# Patient Record
Sex: Male | Born: 1935 | Race: White | Hispanic: No | Smoking: Never smoker
Health system: Southern US, Community
[De-identification: ages and names within clinical notes are randomized; demographics above are authoritative.]

## PROBLEM LIST (undated history)

## (undated) DIAGNOSIS — E7800 Pure hypercholesterolemia, unspecified: Secondary | ICD-10-CM

## (undated) DIAGNOSIS — I10 Essential (primary) hypertension: Secondary | ICD-10-CM

## (undated) DIAGNOSIS — E119 Type 2 diabetes mellitus without complications: Secondary | ICD-10-CM

## (undated) DIAGNOSIS — I2510 Atherosclerotic heart disease of native coronary artery without angina pectoris: Secondary | ICD-10-CM

## (undated) HISTORY — PX: EYE SURGERY: SHX253

## (undated) HISTORY — PX: HEMORRHOID SURGERY: SHX153

## (undated) HISTORY — PX: COLECTOMY: SHX59

## (undated) HISTORY — PX: TONSILLECTOMY: SUR1361

## (undated) HISTORY — PX: OTHER SURGICAL HISTORY: SHX169

---
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
## 1990-09-19 HISTORY — PX: CHOLECYSTECTOMY: SHX55

## 2008-08-01 ENCOUNTER — Emergency Department (HOSPITAL_BASED_OUTPATIENT_CLINIC_OR_DEPARTMENT_OTHER): Admission: EM | Admit: 2008-08-01 | Discharge: 2008-08-01 | Payer: Self-pay | Admitting: Emergency Medicine

## 2013-06-27 ENCOUNTER — Emergency Department (HOSPITAL_BASED_OUTPATIENT_CLINIC_OR_DEPARTMENT_OTHER): Payer: Medicare HMO

## 2013-06-27 ENCOUNTER — Emergency Department (HOSPITAL_BASED_OUTPATIENT_CLINIC_OR_DEPARTMENT_OTHER)
Admission: EM | Admit: 2013-06-27 | Discharge: 2013-06-27 | Disposition: A | Discharge status: Home / Self Care | Payer: Medicare HMO | Attending: Emergency Medicine | Admitting: Emergency Medicine

## 2013-06-27 ENCOUNTER — Encounter (HOSPITAL_BASED_OUTPATIENT_CLINIC_OR_DEPARTMENT_OTHER): Payer: Self-pay | Admitting: Emergency Medicine

## 2013-06-27 DIAGNOSIS — I10 Essential (primary) hypertension: Secondary | ICD-10-CM | POA: Insufficient documentation

## 2013-06-27 DIAGNOSIS — I2510 Atherosclerotic heart disease of native coronary artery without angina pectoris: Secondary | ICD-10-CM | POA: Insufficient documentation

## 2013-06-27 DIAGNOSIS — Z7982 Long term (current) use of aspirin: Secondary | ICD-10-CM | POA: Insufficient documentation

## 2013-06-27 DIAGNOSIS — T148XXA Other injury of unspecified body region, initial encounter: Secondary | ICD-10-CM

## 2013-06-27 DIAGNOSIS — E7800 Pure hypercholesterolemia, unspecified: Secondary | ICD-10-CM | POA: Insufficient documentation

## 2013-06-27 DIAGNOSIS — S61409A Unspecified open wound of unspecified hand, initial encounter: Secondary | ICD-10-CM | POA: Insufficient documentation

## 2013-06-27 DIAGNOSIS — S0003XA Contusion of scalp, initial encounter: Secondary | ICD-10-CM | POA: Insufficient documentation

## 2013-06-27 DIAGNOSIS — R296 Repeated falls: Secondary | ICD-10-CM | POA: Insufficient documentation

## 2013-06-27 DIAGNOSIS — Y9389 Activity, other specified: Secondary | ICD-10-CM | POA: Insufficient documentation

## 2013-06-27 DIAGNOSIS — W19XXXA Unspecified fall, initial encounter: Secondary | ICD-10-CM

## 2013-06-27 DIAGNOSIS — E119 Type 2 diabetes mellitus without complications: Secondary | ICD-10-CM | POA: Insufficient documentation

## 2013-06-27 DIAGNOSIS — S61412A Laceration without foreign body of left hand, initial encounter: Secondary | ICD-10-CM

## 2013-06-27 DIAGNOSIS — Z23 Encounter for immunization: Secondary | ICD-10-CM | POA: Insufficient documentation

## 2013-06-27 DIAGNOSIS — Y929 Unspecified place or not applicable: Secondary | ICD-10-CM | POA: Insufficient documentation

## 2013-06-27 DIAGNOSIS — Z9861 Coronary angioplasty status: Secondary | ICD-10-CM | POA: Insufficient documentation

## 2013-06-27 DIAGNOSIS — Z79899 Other long term (current) drug therapy: Secondary | ICD-10-CM | POA: Insufficient documentation

## 2013-06-27 HISTORY — DX: Atherosclerotic heart disease of native coronary artery without angina pectoris: I25.10

## 2013-06-27 HISTORY — DX: Type 2 diabetes mellitus without complications: E11.9

## 2013-06-27 HISTORY — DX: Pure hypercholesterolemia, unspecified: E78.00

## 2013-06-27 HISTORY — DX: Essential (primary) hypertension: I10

## 2013-06-27 MED ORDER — HYDROCODONE-ACETAMINOPHEN 5-325 MG PO TABS: 1.0000 | ORAL_TABLET | Freq: Three times a day (TID) | ORAL | Status: DC | PRN

## 2013-06-27 MED ORDER — SULFAMETHOXAZOLE-TMP DS 800-160 MG PO TABS
1.0000 | ORAL_TABLET | Freq: Once | ORAL | Status: AC
  Administered 2013-06-27: 1 via ORAL
  Filled 2013-06-27: qty 1

## 2013-06-27 MED ORDER — TETANUS-DIPHTH-ACELL PERTUSSIS 5-2.5-18.5 LF-MCG/0.5 IM SUSP
0.5000 mL | Freq: Once | INTRAMUSCULAR | Status: AC
  Administered 2013-06-27: 0.5 mL via INTRAMUSCULAR
  Filled 2013-06-27: qty 0.5

## 2013-06-27 MED ORDER — CEPHALEXIN 250 MG PO CAPS
500.0000 mg | ORAL_CAPSULE | Freq: Once | ORAL | Status: AC
  Administered 2013-06-27: 500 mg via ORAL
  Filled 2013-06-27: qty 2

## 2013-06-27 MED ORDER — CEPHALEXIN 500 MG PO CAPS: 500.0000 mg | ORAL_CAPSULE | Freq: Four times a day (QID) | ORAL | Status: DC

## 2013-06-27 MED ORDER — SULFAMETHOXAZOLE-TRIMETHOPRIM 800-160 MG PO TABS: 1.0000 | ORAL_TABLET | Freq: Two times a day (BID) | ORAL | Status: DC

## 2013-06-27 MED ORDER — IBUPROFEN 400 MG PO TABS: 400.0000 mg | ORAL_TABLET | Freq: Four times a day (QID) | ORAL | Status: DC | PRN

## 2013-06-27 NOTE — ED Notes (Signed)
When to get the mail and fell. Hematoma to his left forehead. Skin tear to his left hand. Abrasion to his left 5th toes. No LOC.

## 2013-06-27 NOTE — ED Provider Notes (Addendum)
CSN: [id number]     Arrival date & time 06/27/13  1346 History   First MD Initiated Contact with Patient 06/27/13 1432     Chief Complaint  Patient presents with  . Fall   (Consider location/radiation/quality/duration/timing/severity/associated sxs/prior Treatment) HPI Comments: 79 y/o male comes in post fall. Hx of CAD, DN, HTN. Had a mechanical fall while getting mail. Denies any headaches, nausea, vomiting, visual complains, seizures, altered mental status, loss of consciousness, new weakness, or numbness, no gait instability. Pt has pain in his left hand. He is right handed. Pt reports easily tearing his skin and having some bleed.    Patient is a 79 y.o. male presenting with fall. The history is provided by the patient.  Fall Pertinent negatives include no chest pain, no abdominal pain and no shortness of breath.    Past Medical History  Diagnosis Date  . Diabetes mellitus without complication   . Hypertension   . Coronary artery disease   . High cholesterol    Past Surgical History  Procedure Laterality Date  . Tonsillectomy    . Cholecystectomy    . Eye surgery    . Cardiac stents    . Appendectomy     No family history on file. History  Substance Use Topics  . Smoking status: Never Smoker   . Smokeless tobacco: Not on file  . Alcohol Use: No    Review of Systems  Constitutional: Negative for activity change and appetite change.  Respiratory: Negative for cough and shortness of breath.   Cardiovascular: Negative for chest pain.  Gastrointestinal: Negative for abdominal pain.  Genitourinary: Negative for dysuria.  Musculoskeletal: Positive for arthralgias.  Skin: Positive for wound.  Hematological: Bruises/bleeds easily.    Allergies  Codeine  Home Medications   Current Outpatient Rx  Name  Route  Sig  Dispense  Refill  . aspirin 81 MG tablet   Oral   Take 81 mg by mouth daily.         . fenofibrate (TRICOR) 145 MG tablet   Oral   Take 145 mg by  mouth daily.         . folic acid (FOLVITE) 1 MG tablet   Oral   Take 1 mg by mouth daily.         Marland Kitchen glipiZIDE (GLUCOTROL XL) 10 MG 24 hr tablet   Oral   Take 10 mg by mouth daily.         . Levothyroxine Sodium (SYNTHROID PO)   Oral   Take by mouth.         . meloxicam (MOBIC) 7.5 MG tablet   Oral   Take 7.5 mg by mouth daily.         . metFORMIN (GLUCOPHAGE) 500 MG tablet   Oral   Take 500 mg by mouth 2 (two) times daily with a meal.         . metoprolol (LOPRESSOR) 100 MG tablet   Oral   Take 100 mg by mouth 2 (two) times daily.         Marland Kitchen omeprazole (PRILOSEC) 20 MG capsule   Oral   Take 20 mg by mouth daily.         . simvastatin (ZOCOR) 40 MG tablet   Oral   Take 40 mg by mouth every evening.          BP 127/54  Pulse 60  Temp(Src) 98.3 F (36.8 C) (Oral)  Resp 18  SpO2 95% Physical  Exam  Constitutional: He is oriented to person, place, and time. He appears well-developed.  HENT:  Head: Normocephalic and atraumatic.  Eyes: Conjunctivae and EOM are normal. Pupils are equal, round, and reactive to light.  Neck: Normal range of motion. Neck supple.  No midline c-spine tenderness, pt able to turn head to 45 degrees bilaterally without any pain and able to flex neck to the chest and extend without any pain or neurologic symptoms.   Cardiovascular: Normal rate and regular rhythm.   Pulmonary/Chest: Effort normal and breath sounds normal.  Abdominal: Soft. Bowel sounds are normal. He exhibits no distension. There is no tenderness. There is no rebound and no guarding.  Musculoskeletal:  Head to toe evaluation shows + hematoma, + left hand - large skin tear/abrasion - exposing the   Negative: bleeding of the scalp, no facial abrasions, step offs, crepitus, no tenderness to palpation of the bilateral upper and lower extremities, no gross deformities, no chest tenderness, no pelvic pain.   Neurological: He is alert and oriented to person, place,  and time.  Skin: Skin is warm.    ED Course  Procedures (including critical care time) Labs Review Labs Reviewed - No data to display Imaging Review Ct Head Wo Contrast  06/27/2013   CLINICAL DATA:  Fall, large left frontal hematoma  EXAM: CT HEAD WITHOUT CONTRAST  TECHNIQUE: Contiguous axial images were obtained from the base of the skull through the vertex without intravenous contrast.  COMPARISON:  08/01/2008  FINDINGS: No evidence of parenchymal hemorrhage or extra-axial fluid collection. No mass lesion, mass effect, or midline shift.  No CT evidence of acute infarction.  Subcortical white matter and periventricular small vessel ischemic changes. Intracranial atherosclerosis.  Age related atrophy. No ventriculomegaly.  The visualized paranasal sinuses are essentially clear. The mastoid air cells are unopacified.  Extracranial hematoma overlying the left frontal bone, measuring 7 mm in thickness (series 2/ image 22).  No evidence of calvarial fracture.  IMPRESSION: Extracranial hematoma overlying the left frontal bone.  No evidence of calvarial fracture.  No evidence of acute intracranial abnormality.   Electronically Signed   By: Charline Bills M.D.   On: 06/27/2013 14:57   Dg Hand Complete Left  06/27/2013   CLINICAL DATA:  Left hand pain, status post fall, laceration overlying the 4th and 5th metacarpals  EXAM: LEFT HAND - COMPLETE 3+ VIEW  COMPARISON:  None.  FINDINGS: No fracture or dislocation is seen.  Mild degenerative changes.  Soft tissue laceration along the medial/ulnar aspect of the hand.  Associated 2 mm radiopaque foreign body adjacent to the base of the 5th metacarpal.  IMPRESSION: No fracture or dislocation is seen.  Soft tissue laceration along the medial/ulnar aspect of the hand.  Associated 2 mm radiopaque foreign body adjacent to the base of the 5th metacarpal.   Electronically Signed   By: Charline Bills M.D.   On: 06/27/2013 14:50   Dg Foot Complete Left  06/27/2013    CLINICAL DATA:  Fall, pain/bruising to 4th and 5th digits  EXAM: LEFT FOOT - COMPLETE 3+ VIEW  COMPARISON:  None.  FINDINGS: No fracture or dislocation is seen.  Mild degenerative changes of the 1st MTP joint.  Small plantar and posterior calcaneal enthesophytes.  The visualized soft tissues are unremarkable.  IMPRESSION: No fracture or dislocation is seen.   Electronically Signed   By: Charline Bills M.D.   On: 06/27/2013 14:50    EKG Interpretation   None  MDM  No diagnosis found.  DDx includes: - Mechanical falls - ICH - Fractures - Contusions - Soft tissue injury  Pt comes in post fall. CT head is normal, cspine cleared clinically with [place] CT cspine and nexus criteria. Pt has abrasion to the left hand, with skin tear and loss, with underlying muscle and bone exposure. Pt is diabetic, we will start him on antibiotics and proximate the wound with steri strips, as the skin loss is significant enough where appropriate proximation will be tough.  Wound care discussed, return precautions discussed.    Derwood Kaplan, MD 06/27/13 1543  3:55 PM No foreign body. Will dress the wound, he was soaked in betadine for 15 minutes.   Derwood Kaplan, MD 06/27/13 1555

## 2014-07-05 ENCOUNTER — Emergency Department (HOSPITAL_BASED_OUTPATIENT_CLINIC_OR_DEPARTMENT_OTHER): Payer: Medicare HMO

## 2014-07-05 ENCOUNTER — Encounter (HOSPITAL_BASED_OUTPATIENT_CLINIC_OR_DEPARTMENT_OTHER): Payer: Self-pay | Admitting: Emergency Medicine

## 2014-07-05 ENCOUNTER — Emergency Department (HOSPITAL_BASED_OUTPATIENT_CLINIC_OR_DEPARTMENT_OTHER)
Admission: EM | Admit: 2014-07-05 | Discharge: 2014-07-05 | Disposition: A | Discharge status: Home / Self Care | Payer: Medicare HMO | Attending: Emergency Medicine | Admitting: Emergency Medicine

## 2014-07-05 DIAGNOSIS — S0511XA Contusion of eyeball and orbital tissues, right eye, initial encounter: Secondary | ICD-10-CM | POA: Diagnosis not present

## 2014-07-05 DIAGNOSIS — W19XXXA Unspecified fall, initial encounter: Secondary | ICD-10-CM

## 2014-07-05 DIAGNOSIS — E119 Type 2 diabetes mellitus without complications: Secondary | ICD-10-CM | POA: Insufficient documentation

## 2014-07-05 DIAGNOSIS — W01198A Fall on same level from slipping, tripping and stumbling with subsequent striking against other object, initial encounter: Secondary | ICD-10-CM | POA: Diagnosis not present

## 2014-07-05 DIAGNOSIS — E780 Pure hypercholesterolemia: Secondary | ICD-10-CM | POA: Insufficient documentation

## 2014-07-05 DIAGNOSIS — Y929 Unspecified place or not applicable: Secondary | ICD-10-CM | POA: Insufficient documentation

## 2014-07-05 DIAGNOSIS — S50812A Abrasion of left forearm, initial encounter: Secondary | ICD-10-CM | POA: Diagnosis not present

## 2014-07-05 DIAGNOSIS — S80212A Abrasion, left knee, initial encounter: Secondary | ICD-10-CM | POA: Diagnosis not present

## 2014-07-05 DIAGNOSIS — S0990XA Unspecified injury of head, initial encounter: Secondary | ICD-10-CM | POA: Diagnosis present

## 2014-07-05 DIAGNOSIS — Y9389 Activity, other specified: Secondary | ICD-10-CM | POA: Diagnosis not present

## 2014-07-05 DIAGNOSIS — I2510 Atherosclerotic heart disease of native coronary artery without angina pectoris: Secondary | ICD-10-CM | POA: Insufficient documentation

## 2014-07-05 DIAGNOSIS — S42202A Unspecified fracture of upper end of left humerus, initial encounter for closed fracture: Secondary | ICD-10-CM | POA: Diagnosis not present

## 2014-07-05 DIAGNOSIS — I10 Essential (primary) hypertension: Secondary | ICD-10-CM | POA: Insufficient documentation

## 2014-07-05 MED ORDER — TRAMADOL HCL 50 MG PO TABS: 50.0000 mg | ORAL_TABLET | Freq: Four times a day (QID) | ORAL | Status: DC | PRN

## 2014-07-05 MED ORDER — HYDROCODONE-ACETAMINOPHEN 5-325 MG PO TABS: 1.0000 | ORAL_TABLET | ORAL | Status: DC | PRN

## 2014-07-05 MED ORDER — TRAMADOL HCL 50 MG PO TABS
50.0000 mg | ORAL_TABLET | Freq: Once | ORAL | Status: AC
  Administered 2014-07-05: 50 mg via ORAL
  Filled 2014-07-05: qty 1

## 2014-07-05 NOTE — ED Notes (Signed)
Patient was trying to run off home alarm system and fell on wooden floor on L shoulder, c/o L shoulder pain, abrasion to R wrist, c/o L knee pain

## 2014-07-05 NOTE — ED Provider Notes (Signed)
CSN: [id number]     Arrival date & time 07/05/14  1453 History  This chart was scribed for Glynn Octave, MD by Roxy Cedar, ED Scribe. This patient was seen in room MH09/MH09 and the patient's care was started at 3:06 PM.   Chief Complaint  Patient presents with  . Fall   The history is provided by the patient. No language interpreter was used.   HPI Comments: Brady Glass is a 80 y.o. male, with a history of Type 2 diabetes, and 4 stents in his heart, who presents to the Emergency Department complaining of left shoulder pain and abrasion to right wrist and left knee due to a fall that occurred earlier today. Patient states that he was trying to run to turn off the home alarm system when he tripped, hit his head on the door and fell on the wooden floor. Patient states that he landed on his left shoulder. Patient denies LOC or vision problems. Patient hit his left shoulder, and has an abrasion to his right wrist and left knee. Patient states that he was by himself during incident, but states that his son was outside in the garage and came back moments afterwards to help patient get up. Patient states that he currently takes baby aspirin daily. Patient states that his tetanus is up to date. Patient denies associated neck pain, chest pain, trouble breathing. Patient is allergic to codeine.  Past Medical History  Diagnosis Date  . Diabetes mellitus without complication   . Hypertension   . Coronary artery disease   . High cholesterol    Past Surgical History  Procedure Laterality Date  . Tonsillectomy    . Cholecystectomy    . Cardiac stents      four stents  . Appendectomy    . Eye surgery      cataract  . Hemorrhoid surgery     No family history on file. History  Substance Use Topics  . Smoking status: Never Smoker   . Smokeless tobacco: Not on file  . Alcohol Use: No    Review of Systems  A complete 10 system review of systems was obtained and all systems are negative  except as noted in the HPI and PMH.   Allergies  Codeine  Home Medications   Prior to Admission medications   Medication Sig Start Date End Date Taking? Authorizing Provider  aspirin 81 MG tablet Take 81 mg by mouth daily.    Historical Provider, MD  cephALEXin (KEFLEX) 500 MG capsule Take 1 capsule (500 mg total) by mouth 4 (four) times daily. 06/27/13   Derwood Kaplan, MD  fenofibrate (TRICOR) 145 MG tablet Take 145 mg by mouth daily.    Historical Provider, MD  folic acid (FOLVITE) 1 MG tablet Take 1 mg by mouth daily.    Historical Provider, MD  glipiZIDE (GLUCOTROL XL) 10 MG 24 hr tablet Take 10 mg by mouth daily.    Historical Provider, MD  HYDROcodone-acetaminophen (NORCO/VICODIN) 5-325 MG per tablet Take 1 tablet by mouth every 8 (eight) hours as needed for pain. 06/27/13   Derwood Kaplan, MD  ibuprofen (ADVIL,MOTRIN) 400 MG tablet Take 1 tablet (400 mg total) by mouth every 6 (six) hours as needed for pain. 06/27/13   Derwood Kaplan, MD  Levothyroxine Sodium (SYNTHROID PO) Take by mouth.    Historical Provider, MD  meloxicam (MOBIC) 7.5 MG tablet Take 7.5 mg by mouth daily.    Historical Provider, MD  metFORMIN (GLUCOPHAGE) 500 MG tablet Take  500 mg by mouth 2 (two) times daily with a meal.    Historical Provider, MD  metoprolol (LOPRESSOR) 100 MG tablet Take 100 mg by mouth 2 (two) times daily.    Historical Provider, MD  omeprazole (PRILOSEC) 20 MG capsule Take 20 mg by mouth daily.    Historical Provider, MD  simvastatin (ZOCOR) 40 MG tablet Take 40 mg by mouth every evening.    Historical Provider, MD  sulfamethoxazole-trimethoprim (SEPTRA DS) 800-160 MG per tablet Take 1 tablet by mouth 2 (two) times daily. 06/27/13   Derwood KaplanAnkit Nanavati, MD  traMADol (ULTRAM) 50 MG tablet Take 1 tablet (50 mg total) by mouth every 6 (six) hours as needed. 07/05/14   Glynn OctaveStephen Adysson Revelle, MD   Triage Vitals: BP 180/52  Pulse 72  Temp(Src) 97.5 F (36.4 C) (Oral)  Resp 20  SpO2 94%  Physical Exam   Nursing note and vitals reviewed. Constitutional: He is oriented to person, place, and time. He appears well-developed and well-nourished. No distress.  HENT:  Head: Normocephalic and atraumatic.  Mouth/Throat: Oropharynx is clear and moist. No oropharyngeal exudate.  R periorbital edema and ecchymosis  Eyes: Conjunctivae and EOM are normal. Pupils are equal, round, and reactive to light.  Neck: Normal range of motion. Neck supple.  No meningismus. No C spine tenderness  Cardiovascular: Normal rate, regular rhythm, normal heart sounds and intact distal pulses.   No murmur heard. Pulmonary/Chest: Effort normal and breath sounds normal. No respiratory distress.  Abdominal: Soft. There is no tenderness. There is no rebound and no guarding.  Large reducible nontender ventral hernia.  Musculoskeletal: Normal range of motion. He exhibits no edema and no tenderness.  No cspine tenderness. Tenderness to left lateral shoulder. No deformity. In tact radial pulse. No T or L spine tenderness.  Neurological: He is alert and oriented to person, place, and time. No cranial nerve deficit. He exhibits normal muscle tone. Coordination normal.  No ataxia on finger to nose bilaterally. No pronator drift. 5/5 strength throughout. CN 2-12 intact. Negative Romberg. Equal grip strength. Sensation intact. Gait is normal.   Skin: Skin is warm.  Abrasion to left patella. Skin tear to right ulna forearm. Abrasion to right eyebrow and temple.  Psychiatric: He has a normal mood and affect. His behavior is normal.   ED Course  Procedures (including critical care time)  DIAGNOSTIC STUDIES: Oxygen Saturation is 94% on RA, normal by my interpretation.    COORDINATION OF CARE: 3:28 PM- Discussed plans to order diagnostic imaging and will give patient medications for pain management. Pt advised of plan for treatment and pt agrees.  Labs Review Labs Reviewed - No data to display  Imaging Review Dg Wrist Complete  Right  07/05/2014   CLINICAL DATA:  Fall with right wrist pain.  EXAM: RIGHT WRIST - COMPLETE 3+ VIEW  COMPARISON:  None.  FINDINGS: There are degenerative changes of the radiocarpal joint and radial side of the carpal bones and first metacarpal joint. No definite acute fracture or dislocation. Small vessel atherosclerotic disease is present.  IMPRESSION: No acute findings.   Electronically Signed   By: Elberta Fortisaniel  Boyle M.D.   On: 07/05/2014 16:13   Ct Head Wo Contrast  07/05/2014   CLINICAL DATA:  Right periorbital bruising and laceration through the right eyebrow after falling and hitting his forehead today. Headache, neck pain and shoulder pain.  EXAM: CT HEAD WITHOUT CONTRAST  CT CERVICAL SPINE WITHOUT CONTRAST  TECHNIQUE: Multidetector CT imaging of the head and cervical  spine was performed following the standard protocol without intravenous contrast. Multiplanar CT image reconstructions of the cervical spine were also generated.  COMPARISON:  Head CT dated 06/27/2013  FINDINGS: CT HEAD FINDINGS  Diffusely enlarged ventricles and subarachnoid spaces. Small left frontal scalp hematoma. No skull fracture, intracranial hemorrhage or paranasal sinus air-fluid levels. Atheromatous arterial calcifications at the skull base.  CT CERVICAL SPINE FINDINGS  Multilevel degenerative changes, including changes of DISH. No prevertebral soft tissue swelling, fractures or subluxations. Bilateral carotid artery atheromatous calcifications.  IMPRESSION: 1. No fracture or subluxation. 2. Multilevel degenerative changes and changes of DISH. 3. Bilateral carotid artery atheromatous calcifications.   Electronically Signed   By: Gordan PaymentSteve  Reid M.D.   On: 07/05/2014 16:03   Ct Cervical Spine Wo Contrast  07/05/2014   CLINICAL DATA:  Right periorbital bruising and laceration through the right eyebrow after falling and hitting his forehead today. Headache, neck pain and shoulder pain.  EXAM: CT HEAD WITHOUT CONTRAST  CT CERVICAL  SPINE WITHOUT CONTRAST  TECHNIQUE: Multidetector CT imaging of the head and cervical spine was performed following the standard protocol without intravenous contrast. Multiplanar CT image reconstructions of the cervical spine were also generated.  COMPARISON:  Head CT dated 06/27/2013  FINDINGS: CT HEAD FINDINGS  Diffusely enlarged ventricles and subarachnoid spaces. Small left frontal scalp hematoma. No skull fracture, intracranial hemorrhage or paranasal sinus air-fluid levels. Atheromatous arterial calcifications at the skull base.  CT CERVICAL SPINE FINDINGS  Multilevel degenerative changes, including changes of DISH. No prevertebral soft tissue swelling, fractures or subluxations. Bilateral carotid artery atheromatous calcifications.  IMPRESSION: 1. No fracture or subluxation. 2. Multilevel degenerative changes and changes of DISH. 3. Bilateral carotid artery atheromatous calcifications.   Electronically Signed   By: Gordan PaymentSteve  Reid M.D.   On: 07/05/2014 16:03   Dg Shoulder Left  07/05/2014   CLINICAL DATA:  Status post fall today. Left shoulder pain. Initial encounter.  EXAM: LEFT SHOULDER - 2+ VIEW  COMPARISON:  None.  FINDINGS: The patient has a mildly impacted surgical neck fracture of the left humerus. The humeral head is located. Acromioclavicular degenerative change is noted. Imaged left lung and ribs appear normal.  IMPRESSION: Mildly impacted surgical neck fracture left humerus.   Electronically Signed   By: Drusilla Kannerhomas  Dalessio M.D.   On: 07/05/2014 16:12   Dg Knee Complete 4 Views Left  07/05/2014   CLINICAL DATA:  Initial evaluation for left knee laceration after a fall today  EXAM: LEFT KNEE - COMPLETE 4+ VIEW  COMPARISON:  None.  FINDINGS: No fracture or dislocation. Mild medial compartment joint space narrowing. No joint effusion. Extensive femoral and popliteal artery calcification. No radiodense foreign body.  IMPRESSION: No acute finding   Electronically Signed   By: Esperanza Heiraymond  Rubner M.D.   On:  07/05/2014 16:14     EKG Interpretation None     MDM   Final diagnoses:  Fall, initial encounter  Proximal humerus fracture, left, closed, initial encounter   Mechanical fall with left shoulder, right wrist and left knee pain and right head pain. No loss of consciousness. No blood thinner use. No neck, chest, back or abdominal pain.  CT head and C spine negative. X-ray remarkable for left proximal humerus fracture. Neurovascularly intact. Wounds cleansed. No indication for sutures. Tetanus uptodate.   Followup with orthopedics. Pain control. Patient is able to ambulate with assistance which is his baseline.   I personally performed the services described in this documentation, which was scribed in my  presence. The recorded information has been reviewed and is accurate.  Glynn Octave, MD 07/05/14 [phone]

## 2014-07-05 NOTE — Discharge Instructions (Signed)
Humerus Fracture, Treated with Immobilization Follow up with Dr. Carola FrostHandy.  Return to the ED if you develop increased pain, swelling, weakness, numbness, tingling, or any other concerns. The humerus is the large bone in your upper arm. You have a broken (fractured) humerus. These fractures are easily diagnosed with X-rays. TREATMENT  Simple fractures which will heal without disability are treated with simple immobilization. Immobilization means you will wear a cast, splint, or sling. You have a fracture which will do well with immobilization. The fracture will heal well simply by being held in a good position until it is stable enough to begin range of motion exercises. Do not take part in activities which would further injure your arm.  HOME CARE INSTRUCTIONS   Put ice on the injured area.  Put ice in a plastic bag.  Place a towel between your skin and the bag.  Leave the ice on for 15-20 minutes, 03-04 times a day.  If you have a cast:  Do not scratch the skin under the cast using sharp or pointed objects.  Check the skin around the cast every day. You may put lotion on any red or sore areas.  Keep your cast dry and clean.  If you have a splint:  Wear the splint as directed.  Keep your splint dry and clean.  You may loosen the elastic around the splint if your fingers become numb, tingle, or turn cold or blue.  If you have a sling:  Wear the sling as directed.  Do not put pressure on any part of your cast or splint until it is fully hardened.  Your cast or splint can be protected during bathing with a plastic bag. Do not lower the cast or splint into water.  Only take over-the-counter or prescription medicines for pain, discomfort, or fever as directed by your caregiver.  Do range of motion exercises as instructed by your caregiver.  Follow up as directed by your caregiver. This is very important in order to avoid permanent injury or disability and chronic pain. SEEK  IMMEDIATE MEDICAL CARE IF:   Your skin or nails in the injured arm turn blue or gray.  Your arm feels cold or numb.  You develop severe pain in the injured arm.  You are having problems with the medicines you were given. MAKE SURE YOU:   Understand these instructions.  Will watch your condition.  Will get help right away if you are not doing well or get worse. Document Released: 12/12/2000 Document Revised: 11/28/2011 Document Reviewed: 10/20/2010 [facility] Patient Information 2015 [employer] [place]. This information is not intended to replace advice given to you by your health care provider. Make sure you discuss any questions you have with your health care provider.

## 2016-02-22 ENCOUNTER — Encounter (HOSPITAL_COMMUNITY): Payer: Self-pay | Admitting: Emergency Medicine

## 2016-02-22 ENCOUNTER — Emergency Department (HOSPITAL_COMMUNITY): Payer: Medicare HMO

## 2016-02-22 ENCOUNTER — Inpatient Hospital Stay (HOSPITAL_COMMUNITY)
Admission: EM | Admit: 2016-02-22 | Discharge: 2016-02-25 | DRG: 280 | Disposition: A | Discharge status: Home / Self Care | Payer: Medicare HMO | Source: Ambulatory Visit | Attending: Cardiovascular Disease | Admitting: Cardiovascular Disease

## 2016-02-22 DIAGNOSIS — I5043 Acute on chronic combined systolic (congestive) and diastolic (congestive) heart failure: Secondary | ICD-10-CM | POA: Diagnosis present

## 2016-02-22 DIAGNOSIS — E1122 Type 2 diabetes mellitus with diabetic chronic kidney disease: Secondary | ICD-10-CM | POA: Diagnosis present

## 2016-02-22 DIAGNOSIS — Z7984 Long term (current) use of oral hypoglycemic drugs: Secondary | ICD-10-CM

## 2016-02-22 DIAGNOSIS — I513 Intracardiac thrombosis, not elsewhere classified: Secondary | ICD-10-CM

## 2016-02-22 DIAGNOSIS — N183 Chronic kidney disease, stage 3 unspecified: Secondary | ICD-10-CM

## 2016-02-22 DIAGNOSIS — Z794 Long term (current) use of insulin: Secondary | ICD-10-CM | POA: Diagnosis not present

## 2016-02-22 DIAGNOSIS — I5021 Acute systolic (congestive) heart failure: Secondary | ICD-10-CM | POA: Diagnosis present

## 2016-02-22 DIAGNOSIS — R079 Chest pain, unspecified: Secondary | ICD-10-CM | POA: Diagnosis present

## 2016-02-22 DIAGNOSIS — I214 Non-ST elevation (NSTEMI) myocardial infarction: Principal | ICD-10-CM | POA: Diagnosis present

## 2016-02-22 DIAGNOSIS — N184 Chronic kidney disease, stage 4 (severe): Secondary | ICD-10-CM | POA: Diagnosis not present

## 2016-02-22 DIAGNOSIS — E039 Hypothyroidism, unspecified: Secondary | ICD-10-CM | POA: Diagnosis present

## 2016-02-22 DIAGNOSIS — E785 Hyperlipidemia, unspecified: Secondary | ICD-10-CM | POA: Diagnosis present

## 2016-02-22 DIAGNOSIS — I10 Essential (primary) hypertension: Secondary | ICD-10-CM

## 2016-02-22 DIAGNOSIS — Z9861 Coronary angioplasty status: Secondary | ICD-10-CM

## 2016-02-22 DIAGNOSIS — I110 Hypertensive heart disease with heart failure: Secondary | ICD-10-CM | POA: Diagnosis not present

## 2016-02-22 DIAGNOSIS — I130 Hypertensive heart and chronic kidney disease with heart failure and stage 1 through stage 4 chronic kidney disease, or unspecified chronic kidney disease: Secondary | ICD-10-CM | POA: Diagnosis present

## 2016-02-22 DIAGNOSIS — R0602 Shortness of breath: Secondary | ICD-10-CM

## 2016-02-22 DIAGNOSIS — I509 Heart failure, unspecified: Secondary | ICD-10-CM | POA: Diagnosis not present

## 2016-02-22 DIAGNOSIS — Z66 Do not resuscitate: Secondary | ICD-10-CM | POA: Diagnosis not present

## 2016-02-22 DIAGNOSIS — N179 Acute kidney failure, unspecified: Secondary | ICD-10-CM | POA: Diagnosis present

## 2016-02-22 DIAGNOSIS — R9431 Abnormal electrocardiogram [ECG] [EKG]: Secondary | ICD-10-CM | POA: Diagnosis present

## 2016-02-22 DIAGNOSIS — Z955 Presence of coronary angioplasty implant and graft: Secondary | ICD-10-CM

## 2016-02-22 DIAGNOSIS — Z7982 Long term (current) use of aspirin: Secondary | ICD-10-CM | POA: Diagnosis not present

## 2016-02-22 DIAGNOSIS — I255 Ischemic cardiomyopathy: Secondary | ICD-10-CM | POA: Diagnosis present

## 2016-02-22 DIAGNOSIS — I2510 Atherosclerotic heart disease of native coronary artery without angina pectoris: Secondary | ICD-10-CM

## 2016-02-22 LAB — GLUCOSE, CAPILLARY: GLUCOSE-CAPILLARY: 83 mg/dL (ref 65–99)

## 2016-02-22 LAB — BASIC METABOLIC PANEL
ANION GAP: 11 (ref 5–15)
BUN: 43 mg/dL — ABNORMAL HIGH (ref 6–20)
CO2: 27 mmol/L (ref 22–32)
Calcium: 9.2 mg/dL (ref 8.9–10.3)
Chloride: 106 mmol/L (ref 101–111)
Creatinine, Ser: 2.29 mg/dL — ABNORMAL HIGH (ref 0.61–1.24)
GFR calc non Af Amer: 23 mL/min — ABNORMAL LOW (ref >60)
GFR, EST AFRICAN AMERICAN: 26 mL/min — AB (ref >60)
GLUCOSE: 85 mg/dL (ref 65–99)
POTASSIUM: 4.4 mmol/L (ref 3.5–5.1)
Sodium: 144 mmol/L (ref 135–145)

## 2016-02-22 LAB — CBC WITH DIFFERENTIAL/PLATELET
BASOS PCT: 0 %
Basophils Absolute: 0 10*3/uL (ref 0.0–0.1)
Eosinophils Absolute: 0.1 10*3/uL (ref 0.0–0.7)
Eosinophils Relative: 1 %
HCT: 44.6 % (ref 39.0–52.0)
HEMOGLOBIN: 13.7 g/dL (ref 13.0–17.0)
LYMPHS PCT: 20 %
Lymphs Abs: 1.2 10*3/uL (ref 0.7–4.0)
MCH: 31.6 pg (ref 26.0–34.0)
MCHC: 30.7 g/dL (ref 30.0–36.0)
MCV: 103 fL — AB (ref 78.0–100.0)
Monocytes Absolute: 0.5 10*3/uL (ref 0.1–1.0)
Monocytes Relative: 8 %
NEUTROS ABS: 4.3 10*3/uL (ref 1.7–7.7)
NEUTROS PCT: 71 %
Platelets: 166 10*3/uL (ref 150–400)
RBC: 4.33 MIL/uL (ref 4.22–5.81)
RDW: 14.5 % (ref 11.5–15.5)
WBC: 6 10*3/uL (ref 4.0–10.5)

## 2016-02-22 LAB — BRAIN NATRIURETIC PEPTIDE: B NATRIURETIC PEPTIDE 5: 2568.9 pg/mL — AB (ref 0.0–100.0)

## 2016-02-22 LAB — I-STAT TROPONIN, ED: Troponin i, poc: 1.99 ng/mL (ref 0.00–0.08)

## 2016-02-22 LAB — TROPONIN I: Troponin I: 2.34 ng/mL (ref <0.031)

## 2016-02-22 LAB — PROTIME-INR
INR: 1.23 (ref 0.00–1.49)
Prothrombin Time: 15.7 seconds — ABNORMAL HIGH (ref 11.6–15.2)

## 2016-02-22 LAB — TSH: TSH: 2.329 u[IU]/mL (ref 0.350–4.500)

## 2016-02-22 MED ORDER — ALBUTEROL SULFATE (2.5 MG/3ML) 0.083% IN NEBU
2.5000 mg | INHALATION_SOLUTION | Freq: Three times a day (TID) | RESPIRATORY_TRACT | Status: DC
  Administered 2016-02-22 – 2016-02-25 (×9): 2.5 mg via RESPIRATORY_TRACT
  Filled 2016-02-22 (×8): qty 3

## 2016-02-22 MED ORDER — ASPIRIN 81 MG PO CHEW
324.0000 mg | CHEWABLE_TABLET | Freq: Once | ORAL | Status: AC
  Administered 2016-02-22: 324 mg via ORAL
  Filled 2016-02-22: qty 4

## 2016-02-22 MED ORDER — SODIUM CHLORIDE 0.9% FLUSH: 3.0000 mL | INTRAVENOUS | Status: DC | PRN

## 2016-02-22 MED ORDER — PANTOPRAZOLE SODIUM 40 MG PO TBEC
40.0000 mg | DELAYED_RELEASE_TABLET | Freq: Every day | ORAL | Status: DC
  Administered 2016-02-23 – 2016-02-25 (×3): 40 mg via ORAL
  Filled 2016-02-22 (×3): qty 1

## 2016-02-22 MED ORDER — FUROSEMIDE 10 MG/ML IJ SOLN
80.0000 mg | Freq: Two times a day (BID) | INTRAMUSCULAR | Status: AC
  Administered 2016-02-22 – 2016-02-23 (×2): 80 mg via INTRAVENOUS
  Filled 2016-02-22 (×2): qty 8

## 2016-02-22 MED ORDER — ONDANSETRON HCL 4 MG/2ML IJ SOLN: 4.0000 mg | Freq: Four times a day (QID) | INTRAMUSCULAR | Status: DC | PRN

## 2016-02-22 MED ORDER — INSULIN ASPART 100 UNIT/ML ~~LOC~~ SOLN: 0.0000 [IU] | Freq: Three times a day (TID) | SUBCUTANEOUS | Status: DC

## 2016-02-22 MED ORDER — ACETAMINOPHEN 325 MG PO TABS: 650.0000 mg | ORAL_TABLET | ORAL | Status: DC | PRN

## 2016-02-22 MED ORDER — HEPARIN BOLUS VIA INFUSION
4000.0000 [IU] | Freq: Once | INTRAVENOUS | Status: AC
  Administered 2016-02-22: 4000 [IU] via INTRAVENOUS
  Filled 2016-02-22: qty 4000

## 2016-02-22 MED ORDER — INSULIN ASPART 100 UNIT/ML ~~LOC~~ SOLN
15.0000 [IU] | Freq: Two times a day (BID) | SUBCUTANEOUS | Status: DC
  Administered 2016-02-23: 15 [IU] via SUBCUTANEOUS

## 2016-02-22 MED ORDER — GLIPIZIDE ER 10 MG PO TB24
10.0000 mg | ORAL_TABLET | Freq: Every day | ORAL | Status: DC
  Administered 2016-02-23 – 2016-02-25 (×3): 10 mg via ORAL
  Filled 2016-02-22 (×3): qty 1

## 2016-02-22 MED ORDER — METOPROLOL TARTRATE 50 MG PO TABS
50.0000 mg | ORAL_TABLET | Freq: Two times a day (BID) | ORAL | Status: DC
  Administered 2016-02-22 – 2016-02-25 (×6): 50 mg via ORAL
  Filled 2016-02-22 (×6): qty 1

## 2016-02-22 MED ORDER — ASPIRIN EC 81 MG PO TBEC
81.0000 mg | DELAYED_RELEASE_TABLET | Freq: Every day | ORAL | Status: DC
  Administered 2016-02-23 – 2016-02-25 (×3): 81 mg via ORAL
  Filled 2016-02-22 (×3): qty 1

## 2016-02-22 MED ORDER — NITROGLYCERIN 2 % TD OINT
0.5000 [in_us] | TOPICAL_OINTMENT | Freq: Three times a day (TID) | TRANSDERMAL | Status: DC
  Administered 2016-02-22 – 2016-02-24 (×4): 0.5 [in_us] via TOPICAL
  Filled 2016-02-22: qty 30

## 2016-02-22 MED ORDER — HEPARIN (PORCINE) IN NACL 100-0.45 UNIT/ML-% IJ SOLN
1200.0000 [IU]/h | INTRAMUSCULAR | Status: DC
  Administered 2016-02-22 – 2016-02-23 (×2): 1200 [IU]/h via INTRAVENOUS
  Filled 2016-02-22 (×3): qty 250

## 2016-02-22 MED ORDER — SODIUM CHLORIDE 0.9 % IV SOLN: 250.0000 mL | INTRAVENOUS | Status: DC | PRN

## 2016-02-22 MED ORDER — ALBUTEROL SULFATE (2.5 MG/3ML) 0.083% IN NEBU
2.5000 mg | INHALATION_SOLUTION | Freq: Three times a day (TID) | RESPIRATORY_TRACT | Status: DC
  Filled 2016-02-22: qty 3

## 2016-02-22 MED ORDER — SIMVASTATIN 40 MG PO TABS
40.0000 mg | ORAL_TABLET | Freq: Every evening | ORAL | Status: DC
  Administered 2016-02-22 – 2016-02-24 (×3): 40 mg via ORAL
  Filled 2016-02-22 (×3): qty 1

## 2016-02-22 MED ORDER — FOLIC ACID 1 MG PO TABS
1.0000 mg | ORAL_TABLET | Freq: Every day | ORAL | Status: DC
  Administered 2016-02-23 – 2016-02-25 (×3): 1 mg via ORAL
  Filled 2016-02-22 (×3): qty 1

## 2016-02-22 MED ORDER — LEVOTHYROXINE SODIUM 50 MCG PO TABS
50.0000 ug | ORAL_TABLET | Freq: Every day | ORAL | Status: DC
  Administered 2016-02-23 – 2016-02-25 (×3): 50 ug via ORAL
  Filled 2016-02-22 (×4): qty 1

## 2016-02-22 MED ORDER — POTASSIUM CHLORIDE CRYS ER 20 MEQ PO TBCR
20.0000 meq | EXTENDED_RELEASE_TABLET | Freq: Every day | ORAL | Status: DC
  Filled 2016-02-22: qty 1

## 2016-02-22 MED ORDER — ALBUTEROL SULFATE HFA 108 (90 BASE) MCG/ACT IN AERS: 2.0000 | INHALATION_SPRAY | Freq: Three times a day (TID) | RESPIRATORY_TRACT | Status: DC

## 2016-02-22 MED ORDER — INSULIN ASPART 100 UNIT/ML ~~LOC~~ SOLN: 0.0000 [IU] | Freq: Every day | SUBCUTANEOUS | Status: DC

## 2016-02-22 MED ORDER — SODIUM CHLORIDE 0.9% FLUSH
3.0000 mL | Freq: Two times a day (BID) | INTRAVENOUS | Status: DC
  Administered 2016-02-23 – 2016-02-24 (×2): 3 mL via INTRAVENOUS

## 2016-02-22 MED ORDER — FOLIC ACID 400 MCG PO TABS: 800.0000 ug | ORAL_TABLET | Freq: Every day | ORAL | Status: DC

## 2016-02-22 MED ORDER — FENOFIBRATE 54 MG PO TABS
54.0000 mg | ORAL_TABLET | Freq: Every day | ORAL | Status: DC
  Administered 2016-02-23 – 2016-02-25 (×3): 54 mg via ORAL
  Filled 2016-02-22 (×3): qty 1

## 2016-02-22 MED ORDER — POTASSIUM CHLORIDE CRYS ER 20 MEQ PO TBCR
20.0000 meq | EXTENDED_RELEASE_TABLET | Freq: Every day | ORAL | Status: DC
  Administered 2016-02-23 – 2016-02-25 (×3): 20 meq via ORAL
  Filled 2016-02-22 (×3): qty 1

## 2016-02-22 MED ORDER — NITROGLYCERIN 0.4 MG SL SUBL: 0.4000 mg | SUBLINGUAL_TABLET | SUBLINGUAL | Status: DC | PRN

## 2016-02-22 NOTE — ED Notes (Signed)
Pt instructed to inform RN if his chest pain returns. Family at bedside.

## 2016-02-22 NOTE — ED Notes (Signed)
Just had echo and it showed some changes along with his ekg and now here for admit

## 2016-02-22 NOTE — ED Notes (Signed)
Family at bedside. 

## 2016-02-22 NOTE — ED Notes (Signed)
Patient family states that her was sent over for adm.  Unable to fine adm orders. Patient is speaking with Family Dr. Now.

## 2016-02-22 NOTE — ED Notes (Signed)
Son wishes to speak with someone about pts insurance. registration at bedside.

## 2016-02-22 NOTE — ED Notes (Signed)
cardiology at bedside

## 2016-02-22 NOTE — ED Provider Notes (Addendum)
CSN: [id number]     Arrival date & time 02/22/16  1408 History   First MD Initiated Contact with Patient 02/22/16 1450     Chief Complaint  Patient presents with  . Chest Pain   HPI Patient presents with concerning labs from outside hospital. Patient states that he's been having chest pain and shortness of breath at rest and usual for the last 3 days. His exertional and patient is unable to take 10 steps without feeling significant distress. He has significant past history 4 stents, MI. States that his symptoms are not like a prior MI. He states the chest pain as substernal. He carries nitroglycerin pills in his pocket but has not taken any. He went to see his cardiologist who did an EKG 3 days ago which showed an concerning findings including ST depression and T-wave inversions in the lateral leads. He had a subsequent EKG today and an echo which revealed a intraventricular thrombus and wall motion abnormalities. Patient was supposed to be direct admitted but was instead brought to the emergency department for further evaluation. Patient states he is still having chest pressure. He states his symptoms are severe. I spoke with his cardiologist who states he will fax the results of the echo here. Patient denies any history of PE or DVT, leg swelling, calf pain, recent surgery or immobilization, hemoptysis.  Past Medical History  Diagnosis Date  . Diabetes mellitus without complication (HCC)   . Hypertension   . Coronary artery disease   . High cholesterol    Past Surgical History  Procedure Laterality Date  . Tonsillectomy    . Cholecystectomy    . Cardiac stents      four stents  . Appendectomy    . Eye surgery      cataract  . Hemorrhoid surgery     No family history on file. Social History  Substance Use Topics  . Smoking status: Never Smoker   . Smokeless tobacco: None  . Alcohol Use: No    Review of Systems  Constitutional: Negative for fever.  Respiratory: Positive for  shortness of breath. Negative for cough.   Cardiovascular: Positive for chest pain. Negative for leg swelling.  Gastrointestinal: Negative for abdominal pain and abdominal distention.  Allergic/Immunologic: Negative for immunocompromised state.  Neurological: Negative for syncope.  All other systems reviewed and are negative.     Allergies  Codeine  Home Medications   Prior to Admission medications   Medication Sig Start Date End Date Taking? Authorizing Provider  albuterol (PROVENTIL HFA;VENTOLIN HFA) 108 (90 Base) MCG/ACT inhaler Inhale 2 puffs into the lungs 3 (three) times daily.   Yes Historical Provider, MD  aspirin 81 MG tablet Take 81 mg by mouth daily.   Yes Historical Provider, MD  cefdinir (OMNICEF) 300 MG capsule Take 300 mg by mouth 2 (two) times daily. 02/20/16  Yes Historical Provider, MD  Cholecalciferol (VITAMIN D) 2000 units CAPS Take 2,000 Units by mouth daily.   Yes Historical Provider, MD  Coenzyme Q10 (CO Q 10 PO) Take 1 tablet by mouth daily.   Yes Historical Provider, MD  fenofibrate micronized (ANTARA) 130 MG capsule Take 130 mg by mouth daily. 12/11/15  Yes Historical Provider, MD  folic acid (FOLVITE) 400 MCG tablet Take 800 mcg by mouth daily.   Yes Historical Provider, MD  furosemide (LASIX) 40 MG tablet Take 40 mg by mouth daily.   Yes Historical Provider, MD  glipiZIDE (GLUCOTROL XL) 10 MG 24 hr tablet Take 10  mg by mouth daily.   Yes Historical Provider, MD  ibuprofen (ADVIL,MOTRIN) 400 MG tablet Take 1 tablet (400 mg total) by mouth every 6 (six) hours as needed for pain. 06/27/13  Yes Derwood Kaplan, MD  insulin aspart (NOVOLOG) 100 UNIT/ML injection Inject 15 Units into the skin 2 (two) times daily.   Yes Historical Provider, MD  levothyroxine (SYNTHROID, LEVOTHROID) 50 MCG tablet Take 50 mcg by mouth daily. 01/24/16  Yes Historical Provider, MD  meloxicam (MOBIC) 7.5 MG tablet Take 7.5 mg by mouth daily.   Yes Historical Provider, MD  metoprolol (LOPRESSOR)  100 MG tablet Take 50 mg by mouth 2 (two) times daily.    Yes Historical Provider, MD  Multiple Vitamins-Minerals (MULTIVITAL PO) Take 1 tablet by mouth daily. MegaRed   Yes Historical Provider, MD  omeprazole (PRILOSEC) 40 MG capsule Take 40 mg by mouth daily.   Yes Historical Provider, MD  potassium chloride SA (K-DUR,KLOR-CON) 20 MEQ tablet Take 20 mEq by mouth daily. 11/23/15  Yes Historical Provider, MD  simvastatin (ZOCOR) 40 MG tablet Take 40 mg by mouth every evening.   Yes Historical Provider, MD  vitamin B-12 (CYANOCOBALAMIN) 1000 MCG tablet Take 1,000 mcg by mouth daily.   Yes Historical Provider, MD   BP 111/85 mmHg  Pulse 78  Temp(Src) 97.9 F (36.6 C) (Oral)  Resp 19  Ht 5\' 8"  (1.727 m)  Wt 84.324 kg  BMI 28.27 kg/m2  SpO2 91% Physical Exam  Constitutional: He appears well-developed and well-nourished. No distress.  HENT:  Head: Normocephalic and atraumatic.  Left Ear: External ear normal.  Eyes: Conjunctivae are normal. Pupils are equal, round, and reactive to light. Right eye exhibits no discharge. Left eye exhibits no discharge.  Neck: Normal range of motion. Neck supple. JVD present.  Cardiovascular: Normal rate and regular rhythm.   No murmur heard. Pulmonary/Chest: Effort normal. No respiratory distress. He has rales (bibasilar rales). He exhibits no tenderness.  Abdominal: Soft. Bowel sounds are normal. He exhibits mass (large hernia). He exhibits no distension. There is no tenderness. There is no rebound and no guarding.  Musculoskeletal: He exhibits no edema (of LEs).  Neurological: He is alert.  Skin: Skin is warm. He is not diaphoretic.  Psychiatric: He has a normal mood and affect.  Nursing note and vitals reviewed.   ED Course  Procedures (including critical care time) Labs Review Labs Reviewed  CBC WITH DIFFERENTIAL/PLATELET - Abnormal; Notable for the following:    MCV 103.0 (*)    All other components within normal limits  BASIC METABOLIC PANEL -  Abnormal; Notable for the following:    BUN 43 (*)    Creatinine, Ser 2.29 (*)    GFR calc non Af Amer 23 (*)    GFR calc Af Amer 26 (*)    All other components within normal limits  PROTIME-INR - Abnormal; Notable for the following:    Prothrombin Time 15.7 (*)    All other components within normal limits  I-STAT TROPOININ, ED - Abnormal; Notable for the following:    Troponin i, poc 1.99 (*)    All other components within normal limits  BRAIN NATRIURETIC PEPTIDE  HEPARIN LEVEL (UNFRACTIONATED)    Imaging Review No results found. I have personally reviewed and evaluated these images and lab results as part of my medical decision-making.   EKG Interpretation   Date/Time:  Monday February 22 2016 14:26:21 EDT Ventricular Rate:  74 PR Interval:  158 QRS Duration: 102 QT Interval:  438 QTC Calculation: 486 R Axis:   -68 Text Interpretation:  Sinus rhythm with Premature atrial complexes in a  pattern of bigeminy Left axis deviation `st depression/t inv inferiorly  and laterally No previous tracing Confirmed by Denton LankSTEINL  MD, Caryn BeeKEVIN ([id number])  on 02/22/2016 3:22:57 PM      MDM   Final diagnoses:  NSTEMI (non-ST elevated myocardial infarction) (HCC)  Mural thrombus of heart (HCC)  Acute systolic heart failure (HCC)    Patient has chest pain with significant coronary history in the setting of echo with wall motion abnormalities and EKG with ST depression in the lateral leads, concerning for a NSTEMI. Trop elevated. Doubt dissection given bilateral pulses/chest pain controlled, PE (Wells 0). Patient also has new-onset of heart failure but not hypoxic. Doubt PNA as no infectious sx. Nitroglycerin ordered but pt states now that he is resting he is CP free and SOB resolved. ASA ordered. Heparin bolus and gtt started. I have spoken with Dr. Hanley Haysosario, his cardiologist. Discussed results with the family. Patient will be admitted to cardiology.    Sidney AceAlison Charruf Jillien Yakel, MD 02/22/16 1606  Cathren LaineKevin  Steinl, MD 02/22/16 [phone]  Sidney AceAlison Charruf Ioannis Schuh, MD 02/22/16 [id number]  Cathren LaineKevin Steinl, MD 03/02/16 [phone]

## 2016-02-22 NOTE — H&P (Signed)
H&P   Primary Cardiologist Dr Hanley Hays [facility]  HPI:   Pleasant 82 y/o male with a history of CAD. He is s/p PCI and stenting x 3 in 1995, followed by PCI with stenting x 1 in 2002 at [facility] in [place]. He moved her in 2006 and has been followed by Dr Hanley Hays at [facility]. The pt has had echos and stress test done and apparently he has done well.  This past Saturday he noted increased DOE and his son took him to [facility]. His EKG was abnormal and he was seen by Dr Hanley Hays today. An echo done today in Dr Rosario's office showed the pt's EF to be 20% with apical AK and a possible mural thrombus. RV pressure was 67 mmHg. Pt's Nov 2015 echo- EF 60-65% with normal WM. The pt was initially sent to [facility] but there was some  Mix up and the son called and spoke to Arnette Felts who then spoke to Dr Allyson Sabal. The pt is now seen in the ED at [facility]. H denies chest pain, he is still SOB but asking for dinner and able to converse and give details of his medical history.   PMHx:  Past Medical History  Diagnosis Date  . Diabetes mellitus without complication (HCC)   . Hypertension   . Coronary artery disease   . High cholesterol     Past Surgical History  Procedure Laterality Date  . Tonsillectomy    . Cholecystectomy  1992  . Cardiac stents  1995, 2002    four stents  . Appendectomy  1937  . Eye surgery      cataract  . Hemorrhoid surgery    . Colectomy      after colonoscopy complication    SOCHx:  reports that he has never smoked. He does not have any smokeless tobacco history on file. He reports that he does not drink alcohol or use illicit drugs.  FAMHx: No family of early CAD  ALLERGIES: Allergies  Allergen Reactions  . Codeine     unknown    ROS: Review of Systems: General: negative for chills, fever, night sweats or weight changes.  Cardiovascular: negative for chest pain, edema, orthopnea, palpitations, paroxysmal nocturnal  dyspnea HEENT: negative for any visual disturbances, blindness, glaucoma Dermatological: negative for rash Respiratory: negative for cough, hemoptysis, or wheezing Urologic: negative for hematuria or dysuria Abdominal: negative for nausea, vomiting, diarrhea, bright red blood per rectum, melena, or hematemesis Neurologic: negative for visual changes, syncope, or dizziness Musculoskeletal: negative for back pain, joint pain, or swelling Psych: cooperative and appropriate All other systems reviewed and are otherwise negative except as noted above.   HOME MEDICATIONS: Prior to Admission medications   Medication Sig Start Date End Date Taking? Authorizing Provider  albuterol (PROVENTIL HFA;VENTOLIN HFA) 108 (90 Base) MCG/ACT inhaler Inhale 2 puffs into the lungs 3 (three) times daily.   Yes Historical Provider, MD  aspirin 81 MG tablet Take 81 mg by mouth daily.   Yes Historical Provider, MD  cefdinir (OMNICEF) 300 MG capsule Take 300 mg by mouth 2 (two) times daily. 02/20/16  Yes Historical Provider, MD  Cholecalciferol (VITAMIN D) 2000 units CAPS Take 2,000 Units by mouth daily.   Yes Historical Provider, MD  Coenzyme Q10 (CO Q 10 PO) Take 1 tablet by mouth daily.   Yes Historical Provider, MD  fenofibrate micronized (ANTARA) 130 MG capsule Take 130 mg by mouth daily. 12/11/15  Yes Historical Provider, MD  folic acid (FOLVITE) 400 MCG tablet Take 800 mcg by mouth daily.   Yes Historical Provider, MD  furosemide (LASIX) 40 MG tablet Take 40 mg by mouth daily.   Yes Historical Provider, MD  glipiZIDE (GLUCOTROL XL) 10 MG 24 hr tablet Take 10 mg by mouth daily.   Yes Historical Provider, MD  ibuprofen (ADVIL,MOTRIN) 400 MG tablet Take 1 tablet (400 mg total) by mouth every 6 (six) hours as needed for pain. 06/27/13  Yes Derwood Kaplan, MD  insulin aspart (NOVOLOG) 100 UNIT/ML injection Inject 15 Units into the skin 2 (two) times daily.   Yes Historical Provider, MD  levothyroxine (SYNTHROID,  LEVOTHROID) 50 MCG tablet Take 50 mcg by mouth daily. 01/24/16  Yes Historical Provider, MD  meloxicam (MOBIC) 7.5 MG tablet Take 7.5 mg by mouth daily.   Yes Historical Provider, MD  metoprolol (LOPRESSOR) 100 MG tablet Take 50 mg by mouth 2 (two) times daily.    Yes Historical Provider, MD  Multiple Vitamins-Minerals (MULTIVITAL PO) Take 1 tablet by mouth daily. MegaRed   Yes Historical Provider, MD  omeprazole (PRILOSEC) 40 MG capsule Take 40 mg by mouth daily.   Yes Historical Provider, MD  potassium chloride SA (K-DUR,KLOR-CON) 20 MEQ tablet Take 20 mEq by mouth daily. 11/23/15  Yes Historical Provider, MD  simvastatin (ZOCOR) 40 MG tablet Take 40 mg by mouth every evening.   Yes Historical Provider, MD  vitamin B-12 (CYANOCOBALAMIN) 1000 MCG tablet Take 1,000 mcg by mouth daily.   Yes Historical Provider, MD    HOSPITAL MEDICATIONS: I have reviewed the patient's current medications.  VITALS: Blood pressure 129/86, pulse 83, temperature 97.9 F (36.6 C), temperature source Oral, resp. rate 19, height  (1.727 m), weight 185 lb 14.4 oz (84.324 kg), SpO2 94 %.  PHYSICAL EXAM: General appearance: alert, cooperative, mild distress, mildly obese and mild SOB Neck: no carotid bruit and no JVD Lungs: rales 1/3 up bilat Heart: regular rate and rhythm Abdomen: large ventral hernia noted Extremities: venous stasis dermatitis noted Pulses: decreased Skin: pale cool dry Neurologic: Grossly normal  LABS: Results for orders placed or performed during the hospital encounter of 02/22/16 (from the past 24 hour(s))  Brain natriuretic peptide     Status: Abnormal   Collection Time: 02/22/16  3:26 PM  Result Value Ref Range   B Natriuretic Peptide 2568.9 (H) 0.0 - 100.0 pg/mL  CBC with Differential     Status: Abnormal   Collection Time: 02/22/16  3:26 PM  Result Value Ref Range   WBC 6.0 4.0 - 10.5 K/uL   RBC 4.33 4.22 - 5.81 MIL/uL   Hemoglobin 13.7 13.0 - 17.0 g/dL   HCT 16.1 09.6 - 04.5  %   MCV 103.0 (H) 78.0 - 100.0 fL   MCH 31.6 26.0 - 34.0 pg   MCHC 30.7 30.0 - 36.0 g/dL   RDW 40.9 81.1 - 91.4 %   Platelets 166 150 - 400 K/uL   Neutrophils Relative % 71 %   Neutro Abs 4.3 1.7 - 7.7 K/uL   Lymphocytes Relative 20 %   Lymphs Abs 1.2 0.7 - 4.0 K/uL   Monocytes Relative 8 %   Monocytes Absolute 0.5 0.1 - 1.0 K/uL   Eosinophils Relative 1 %   Eosinophils Absolute 0.1 0.0 - 0.7 K/uL   Basophils Relative 0 %   Basophils Absolute 0.0 0.0 - 0.1 K/uL  Basic metabolic panel     Status: Abnormal  Collection Time: 02/22/16  3:26 PM  Result Value Ref Range   Sodium 144 135 - 145 mmol/L   Potassium 4.4 3.5 - 5.1 mmol/L   Chloride 106 101 - 111 mmol/L   CO2 27 22 - 32 mmol/L   Glucose, Bld 85 65 - 99 mg/dL   BUN 43 (H) 6 - 20 mg/dL   Creatinine, Ser 1.612.29 (H) 0.61 - 1.24 mg/dL   Calcium 9.2 8.9 - 09.610.3 mg/dL   GFR calc non Af Amer 23 (L) >60 mL/min   GFR calc Af Amer 26 (L) >60 mL/min   Anion gap 11 5 - 15  Protime-INR     Status: Abnormal   Collection Time: 02/22/16  3:26 PM  Result Value Ref Range   Prothrombin Time 15.7 (H) 11.6 - 15.2 seconds   INR 1.23 0.00 - 1.49  I-stat troponin, ED     Status: Abnormal   Collection Time: 02/22/16  3:29 PM  Result Value Ref Range   Troponin i, poc 1.99 (HH) 0.00 - 0.08 ng/mL   Comment NOTIFIED PHYSICIAN    Comment 3            EKG: NSR, new inferior lateral TWI  IMAGING: Dg Chest 2 View  02/22/2016  CLINICAL DATA:  Shortness of breath, coronary disease, hypertension, diabetes mellitus, patient suspects having an MI EXAM: CHEST  2 VIEW COMPARISON:  None currently available; prior exam on the time line from 09/11/2013 does not load for comparison. FINDINGS: Enlargement of cardiac silhouette. Atherosclerotic calcification aorta. Mediastinal contours and pulmonary vascularity normal. Bronchitic changes with minimal bibasilar atelectasis. Elevation of RIGHT diaphragm. Small BILATERAL pleural effusions. No definite acute infiltrate  or pneumothorax. Bones demineralized with ankylosis of the thoracic spine. BILATERAL glenohumeral degenerative changes with posttraumatic deformity of the proximal LEFT humerus, appears old. IMPRESSION: Chronic bronchitic changes with bibasilar atelectasis and small BILATERAL pleural effusions. Electronically Signed   By: Ulyses SouthwardMark  Boles M.D.   On: 02/22/2016 16:38    IMPRESSION: Principal Problem:   Acute systolic (congestive) heart failure (HCC) Active Problems:   Ischemic cardiomyopathy   NSTEMI (non-ST elevated myocardial infarction) (HCC)   EKG abnormalities- new   CAD S/P remote PCI   Type 2 diabetes mellitus with stage 3 chronic kidney disease (HCC)   Chronic renal impairment, stage 3 (moderate)   Hypothyroidism   RECOMMENDATION: Plan to diurese, and follow renal function. Check echo with definity re: mural thrombus, he is on Heparin at this time- contniue.   Time Spent Directly with Patient: 45 minutes  Corine ShelterLuke Kilroy, [place]  [phone] beeper 02/22/2016, 5:36 PM   Agree with note written by Corine ShelterLuke Kilroy PAC  Pt with known CAD s/p mult stents in past. Has been c/o chest heaviness and increasing SOB for last couple of days. 2D showed new severe LVD with EF 20%. EF was nl 12/15. There is a question of apical mural thrombus. BNP elevated and Trop mildly elevated. SCr 2.23 (CrCl 23cc/min). EKG shows new lat TS depression. Will admit for diuresis and possible cath later in week depending on renal FXN.   Nanetta BattyBerry, Nashiya Disbrow 02/22/2016 5:58 PM

## 2016-02-22 NOTE — ED Notes (Signed)
Son requesting not to have any repeat labs done because he just had them done. md given phone number of sending physician to try and sort out if pt was supposed to be a "direct admission". Charge nurse made aware and was unable to find any admission orders on this pt.

## 2016-02-22 NOTE — Progress Notes (Addendum)
ANTICOAGULATION CONSULT NOTE - Initial Consult  Pharmacy Consult for heparin Indication: chest pain/ACS and heart thrombus  Allergies  Allergen Reactions  . Codeine     unknown    Patient Measurements: Height: 5\' 8"  (172.7 cm) Weight: 185 lb 14.4 oz (84.324 kg) IBW/kg (Calculated) : 68.4 Heparin Dosing Weight: 84.3kg  Vital Signs: Temp: 97.9 F (36.6 C) (06/05 1423) Temp Source: Oral (06/05 1423) BP: 110/60 mmHg (06/05 1515) Pulse Rate: 36 (06/05 1515)  Labs: No results for input(s): HGB, HCT, PLT, APTT, LABPROT, INR, HEPARINUNFRC, HEPRLOWMOCWT, CREATININE, CKTOTAL, CKMB, TROPONINI in the last 72 hours.  CrCl cannot be calculated (Patient has no serum creatinine result on file.).   Medical History: Past Medical History  Diagnosis Date  . Diabetes mellitus without complication (HCC)   . Hypertension   . Coronary artery disease   . High cholesterol     Medications:  Infusions:  . heparin      Assessment: 89 yom presented to the ED from his doctors office with CP. Echo done at outside facility that showed a thrombus. Report is being sent. Baseline CBC is WNL. He is not on anticoagulation PTA.   Goal of Therapy:  Heparin level 0.3-0.7 units/ml Monitor platelets by anticoagulation protocol: Yes   Plan:  - Heparin bolus 4000 units IV x 1 - Heparin gtt 1200 units/hr - Check an 8 hour heparin level - Daily heparin level and CBC  Ilisa Hayworth, Drake Leachachel Lynn 02/22/2016,3:37 PM

## 2016-02-23 DIAGNOSIS — N183 Chronic kidney disease, stage 3 (moderate): Secondary | ICD-10-CM

## 2016-02-23 DIAGNOSIS — N179 Acute kidney failure, unspecified: Secondary | ICD-10-CM

## 2016-02-23 DIAGNOSIS — E785 Hyperlipidemia, unspecified: Secondary | ICD-10-CM

## 2016-02-23 DIAGNOSIS — I110 Hypertensive heart disease with heart failure: Secondary | ICD-10-CM

## 2016-02-23 DIAGNOSIS — I10 Essential (primary) hypertension: Secondary | ICD-10-CM

## 2016-02-23 LAB — MRSA PCR SCREENING: MRSA by PCR: NEGATIVE

## 2016-02-23 LAB — CBC
HEMATOCRIT: 43.4 % (ref 39.0–52.0)
Hemoglobin: 13.5 g/dL (ref 13.0–17.0)
MCH: 32.5 pg (ref 26.0–34.0)
MCHC: 31.1 g/dL (ref 30.0–36.0)
MCV: 104.6 fL — AB (ref 78.0–100.0)
Platelets: 137 10*3/uL — ABNORMAL LOW (ref 150–400)
RBC: 4.15 MIL/uL — AB (ref 4.22–5.81)
RDW: 14.5 % (ref 11.5–15.5)
WBC: 6.5 10*3/uL (ref 4.0–10.5)

## 2016-02-23 LAB — GLUCOSE, CAPILLARY
Glucose-Capillary: 145 mg/dL — ABNORMAL HIGH (ref 65–99)
Glucose-Capillary: 134 mg/dL — ABNORMAL HIGH (ref 65–99)
Glucose-Capillary: 65 mg/dL (ref 65–99)
Glucose-Capillary: 98 mg/dL (ref 65–99)
Glucose-Capillary: 178 mg/dL — ABNORMAL HIGH (ref 65–99)

## 2016-02-23 LAB — BASIC METABOLIC PANEL
Anion gap: 11 (ref 5–15)
BUN: 45 mg/dL — ABNORMAL HIGH (ref 6–20)
CO2: 28 mmol/L (ref 22–32)
Calcium: 8.9 mg/dL (ref 8.9–10.3)
Chloride: 102 mmol/L (ref 101–111)
Creatinine, Ser: 2.31 mg/dL — ABNORMAL HIGH (ref 0.61–1.24)
GFR calc Af Amer: 26 mL/min — ABNORMAL LOW (ref >60)
GFR calc non Af Amer: 23 mL/min — ABNORMAL LOW (ref >60)
Glucose, Bld: 173 mg/dL — ABNORMAL HIGH (ref 65–99)
Potassium: 3.9 mmol/L (ref 3.5–5.1)
Sodium: 141 mmol/L (ref 135–145)

## 2016-02-23 LAB — HEPARIN LEVEL (UNFRACTIONATED)
Heparin Unfractionated: 0.46 IU/mL (ref 0.30–0.70)
Heparin Unfractionated: 0.54 IU/mL (ref 0.30–0.70)

## 2016-02-23 LAB — TROPONIN I
Troponin I: 1.77 ng/mL (ref <0.031)
Troponin I: 1.88 ng/mL (ref <0.031)

## 2016-02-23 MED ORDER — INSULIN ASPART 100 UNIT/ML ~~LOC~~ SOLN
15.0000 [IU] | Freq: Two times a day (BID) | SUBCUTANEOUS | Status: DC
  Administered 2016-02-24 – 2016-02-25 (×2): 15 [IU] via SUBCUTANEOUS

## 2016-02-23 MED ORDER — FUROSEMIDE 80 MG PO TABS
80.0000 mg | ORAL_TABLET | Freq: Two times a day (BID) | ORAL | Status: DC
  Administered 2016-02-23 – 2016-02-25 (×4): 80 mg via ORAL
  Filled 2016-02-23 (×4): qty 1

## 2016-02-23 MED ORDER — LIVING BETTER WITH HEART FAILURE BOOK
Freq: Once | Status: AC
  Administered 2016-02-23: 09:00:00

## 2016-02-23 NOTE — Progress Notes (Signed)
Patient: Brady Glass / Admit Date: 02/22/2016 / Date of Encounter: 02/23/2016, 8:47 AM   Subjective: +++UOP since receiving IV Lasix. UOP not yet recorded this AM but he has lost 4lb of fluid so far. Breathing improving. No further chest tightness. I called [facility] to find out baseline Cr and in 12/2015 it was 1.9.   Objective: Telemetry: NSR Physical Exam: Blood pressure 119/63, pulse 79, temperature 97.9 F (36.6 C), temperature source Oral, resp. rate 17, height  (1.727 m), weight 181 lb 11.2 oz (82.419 kg), SpO2 97 %. General: Well developed, well nourished WM, in no acute distress. Pleasantly conversant, engaging, appears younger than stated age Head: Normocephalic, atraumatic, sclera non-icteric, no xanthomas, nares are without discharge. Neck: JVP not elevated. Lungs: Bibasilar rales 1/3 of the way up. No wheezes, rales, or rhonchi. Breathing is unlabored. Heart: RRR S1 S2 without murmurs, rubs, or gallops.  Abdomen: Soft, non-tender, non-distended with normoactive bowel sounds. No rebound/guarding. Extremities: No clubbing or cyanosis. No edema. Venous stasis hyperpigmentation Distal pedal pulses are 2+ and equal bilaterally. Neuro: Alert and oriented X 3. Moves all extremities spontaneously. Psych:  Responds to questions appropriately with a normal affect.   Intake/Output Summary (Last 24 hours) at 02/23/16 0847 Last data filed at 02/23/16 4098  Gross per 24 hour  Intake    480 ml  Output   1200 ml  Net   -720 ml    Inpatient Medications:  . albuterol  2.5 mg Nebulization TID  . aspirin EC  81 mg Oral Daily  . fenofibrate  54 mg Oral Daily  . folic acid  1 mg Oral Daily  . glipiZIDE  10 mg Oral Daily  . insulin aspart  0-15 Units Subcutaneous TID WC  . insulin aspart  0-5 Units Subcutaneous QHS  . insulin aspart  15 Units Subcutaneous BID  . levothyroxine  50 mcg Oral QAC breakfast  . metoprolol  50 mg Oral BID  . nitroGLYCERIN  0.5 inch Topical Q8H  .  pantoprazole  40 mg Oral Daily  . potassium chloride SA  20 mEq Oral Daily  . simvastatin  40 mg Oral QPM  . sodium chloride flush  3 mL Intravenous Q12H   Infusions:  . heparin 1,200 Units/hr (02/22/16 2000)    Labs:  Recent Labs  02/22/16 1526 02/23/16 0016  NA 144 141  K 4.4 3.9  CL 106 102  CO2 27 28  GLUCOSE 85 173*  BUN 43* 45*  CREATININE 2.29* 2.31*  CALCIUM 9.2 8.9   No results for input(s): AST, ALT, ALKPHOS, BILITOT, PROT, ALBUMIN in the last 72 hours.  Recent Labs  02/22/16 1526 02/23/16 0016  WBC 6.0 6.5  NEUTROABS 4.3  --   HGB 13.7 13.5  HCT 44.6 43.4  MCV 103.0* 104.6*  PLT 166 137*    Recent Labs  02/22/16 1841 02/23/16 0016 02/23/16 0644  TROPONINI 2.34* 1.77* 1.88*   Invalid input(s): POCBNP No results for input(s): HGBA1C in the last 72 hours.   Radiology/Studies:  Dg Chest 2 View  02/22/2016  CLINICAL DATA:  Shortness of breath, coronary disease, hypertension, diabetes mellitus, patient suspects having an MI EXAM: CHEST  2 VIEW COMPARISON:  None currently available; prior exam on the time line from 09/11/2013 does not load for comparison. FINDINGS: Enlargement of cardiac silhouette. Atherosclerotic calcification aorta. Mediastinal contours and pulmonary vascularity normal. Bronchitic changes with minimal bibasilar atelectasis. Elevation of RIGHT diaphragm. Small BILATERAL pleural effusions. No definite acute infiltrate or  pneumothorax. Bones demineralized with ankylosis of the thoracic spine. BILATERAL glenohumeral degenerative changes with posttraumatic deformity of the proximal LEFT humerus, appears old. IMPRESSION: Chronic bronchitic changes with bibasilar atelectasis and small BILATERAL pleural effusions. Electronically Signed   By: Ulyses SouthwardMark  Boles M.D.   On: 02/22/2016 16:38     Assessment and Plan  17M with CAD (s/p PCI/stenting x 3 in 1995, stenting x 1 in 2002), DM, HTN, HLD, CKD stage III (Cr 1.9 in 12/2015 per PCP's office) admitted with  SOB and abnormal echo - EF 20% with apical AK and a possible mural thrombus, RV pressure 67 (previously 60-65% with normal WM in 07/2014). Found to have NSTEMI, renal insufficiency of unknown duration, NSTEMI.  1. NSTEMI with prior h/o CAD - contue ASA, BB, statin, nitrate. Will need to follow clinical improvement before committing to cath as he would be at high risk for CIN given his renal insufficiency. We had a discussion about cath versus medical therapy given this risk and at this time the patient is strongly leaning towards medical therapy. Consider changing NTG paste to Imdur and adding Plavix for med rx. Continue heparin for at least 48 hours (also pending eval of LV thrombus).  2. Acute systolic CHF - consider changing Lopressor to carvedilol. He received 80mg  IV Lasix last night and another dose this AM and is continuing to urinate briskly. With bump in Cr will hold off further diuretics and let him ride today out. F/u BMET in AM (plan to d/c standing K if he remains off diuretics). Discussed daily weights, low sodium diet, fluid restrict to <2L per day. Will order dietician consult for further education.  3. Possible LV thrombus - repeat echocardiogram pending with definity to further eval for LV thrombus. Continue IV heparin.  4. CKD stage III - I called [facility] and found out that Cr was 1.9 in 12/2015 so we are not significantly far off from baseline. This will need to be followed. Avoid add'l nephrotoxic agents.  5. HTN - controlled.  6. Diabetes mellitus - continue current regimen.  6. Hyperlipidemia - continue statin and fenofibrate. F/u lipids and hepatic function in AM.   Signed, Ronie Spiesayna Mileah Hemmer PA-C Pager: [phone]

## 2016-02-23 NOTE — Progress Notes (Signed)
ANTICOAGULATION CONSULT NOTE - Follow Up Consult  Pharmacy Consult for Heparin Indication: chest pain/ACS and possible LV thrombus  Allergies  Allergen Reactions  . Codeine     unknown    Patient Measurements: Height: 5\' 8"  (172.7 cm) Weight: 181 lb 11.2 oz (82.419 kg) IBW/kg (Calculated) : 68.4  Vital Signs: Temp: 97.9 F (36.6 C) (06/06 0756) Temp Source: Oral (06/06 0756) BP: 119/63 mmHg (06/06 0756) Pulse Rate: 79 (06/06 0756)  Labs:  Recent Labs  02/22/16 1526 02/22/16 1841 02/23/16 0016 02/23/16 0644  HGB 13.7  --  13.5  --   HCT 44.6  --  43.4  --   PLT 166  --  137*  --   LABPROT 15.7*  --   --   --   INR 1.23  --   --   --   HEPARINUNFRC  --   --  0.46 0.54  CREATININE 2.29*  --  2.31*  --   TROPONINI  --  2.34* 1.77* 1.88*    Estimated Creatinine Clearance: 20.5 mL/min (by C-G formula based on Cr of 2.31).   Medications:  Heparin @ 1200 units/hr  Assessment: Brady Glass continues on heparin for NSTEMI and possible LV thrombus (ECHO pending). Heparin level is therapeutic at 0.54. Plan is for  48 hours of heparin (started 6/5 @ 1552) for his NSTEMI unless LV thrombus present then would need to continue. CBC stable. No bleeding.  Goal of Therapy:  Heparin level 0.3-0.7 units/ml Monitor platelets by anticoagulation protocol: Yes   Plan:  1) Continue heparin at 1200 units/hr 2) Daily heparin level and CBC 3) Follow up ECHO  Fredrik RiggerMarkle, Cherrish Vitali Sue 02/23/2016,11:25 AM

## 2016-02-23 NOTE — Progress Notes (Signed)
ANTICOAGULATION CONSULT NOTE - Follow Up Consult  Pharmacy Consult for heparin Indication: ACS and thrombus   Labs:  Recent Labs  02/22/16 1526 02/22/16 1841 02/23/16 0016  HGB 13.7  --  13.5  HCT 44.6  --  43.4  PLT 166  --  137*  LABPROT 15.7*  --   --   INR 1.23  --   --   HEPARINUNFRC  --   --  0.46  CREATININE 2.29*  --  2.31*  TROPONINI  --  2.34*  --     Assessment/Plan:  82yo male therapeutic on heparin with initial dosing for CP and thrombus. Will continue gtt at current rate and confirm stable with am labs.   Vernard GamblesVeronda Bhargav Barbaro, PharmD, BCPS  02/23/2016,1:04 AM

## 2016-02-23 NOTE — Plan of Care (Cosign Needed)
Problem: Food- and Nutrition-Related Knowledge Deficit (NB-1.1) Goal: Nutrition education Formal process to instruct or train a patient/client in a skill or to impart knowledge to help patients/clients voluntarily manage or modify food choices and eating behavior to maintain or improve health. Outcome: Completed/Met Date Met:  02/23/16 NUTRITION EDUCATION NOTE:   Pt seen for c/s for fluid/sodium restricted diet education. Provided pt with handout "Heart Failure Nutrition Therapy" from the [employer]. Discussed body systems affected by sodium and fluid intake, reasons for restriction, ways to decrease sodium in diet, and fluid restriction of 2 L.   Expect good compliance.  Pt reports daughter in law does the grocery shopping and cooking for him. He states that she had already discussed low sodium diet with him previously. He expects she will follow guidelines well. Pt is amenable to making changes as well. Labs and meds reviewed.  Geoffery Lyons, [place] [facility] Dietetic Intern Pager [phone]

## 2016-02-24 ENCOUNTER — Inpatient Hospital Stay (HOSPITAL_COMMUNITY): Payer: Medicare HMO

## 2016-02-24 DIAGNOSIS — E785 Hyperlipidemia, unspecified: Secondary | ICD-10-CM

## 2016-02-24 DIAGNOSIS — I5043 Acute on chronic combined systolic (congestive) and diastolic (congestive) heart failure: Secondary | ICD-10-CM

## 2016-02-24 DIAGNOSIS — I509 Heart failure, unspecified: Secondary | ICD-10-CM

## 2016-02-24 LAB — LIPID PANEL
CHOL/HDL RATIO: 3 ratio
CHOLESTEROL: 114 mg/dL (ref 0–200)
HDL: 38 mg/dL — ABNORMAL LOW (ref >40)
LDL Cholesterol: 53 mg/dL (ref 0–99)
Triglycerides: 115 mg/dL (ref <150)
VLDL: 23 mg/dL (ref 0–40)

## 2016-02-24 LAB — ECHOCARDIOGRAM COMPLETE
CHL CUP TV REG PEAK VELOCITY: 346 cm/s
E decel time: 257 msec
FS: 13 % — AB (ref 28–44)
HEIGHTINCHES: 68 in
IVS/LV PW RATIO, ED: 1.09
LA diam index: 2.19 cm/m2
LA vol index: 39.6 mL/m2
LA vol: 77.6 mL
LASIZE: 43 mm
LAVOLA4C: 65.6 mL
LDCA: 2.84 cm2
LEFT ATRIUM END SYS DIAM: 43 mm
LVOT diameter: 19 mm
MRPISAEROA: 0.08 cm2
MV Dec: 257
MV VTI: 166 cm
MVPKEVEL: 0.4 m/s
P 1/2 time: 431 ms
PW: 11 mm — AB (ref 0.6–1.1)
TAPSE: 26.7 mm
TRMAXVEL: 346 cm/s
WEIGHTICAEL: 2904 [oz_av]

## 2016-02-24 LAB — HEPATIC FUNCTION PANEL
ALK PHOS: 30 U/L — AB (ref 38–126)
ALT: 14 U/L — ABNORMAL LOW (ref 17–63)
AST: 22 U/L (ref 15–41)
Albumin: 3.1 g/dL — ABNORMAL LOW (ref 3.5–5.0)
BILIRUBIN DIRECT: 0.1 mg/dL (ref 0.1–0.5)
BILIRUBIN INDIRECT: 0.6 mg/dL (ref 0.3–0.9)
BILIRUBIN TOTAL: 0.7 mg/dL (ref 0.3–1.2)
TOTAL PROTEIN: 6.3 g/dL — AB (ref 6.5–8.1)

## 2016-02-24 LAB — CBC
HEMATOCRIT: 40.8 % (ref 39.0–52.0)
Hemoglobin: 12.8 g/dL — ABNORMAL LOW (ref 13.0–17.0)
MCH: 31.8 pg (ref 26.0–34.0)
MCHC: 31.4 g/dL (ref 30.0–36.0)
MCV: 101.5 fL — ABNORMAL HIGH (ref 78.0–100.0)
Platelets: 144 10*3/uL — ABNORMAL LOW (ref 150–400)
RBC: 4.02 MIL/uL — ABNORMAL LOW (ref 4.22–5.81)
RDW: 14.2 % (ref 11.5–15.5)
WBC: 5.4 10*3/uL (ref 4.0–10.5)

## 2016-02-24 LAB — BASIC METABOLIC PANEL
Anion gap: 14 (ref 5–15)
BUN: 47 mg/dL — ABNORMAL HIGH (ref 6–20)
CO2: 28 mmol/L (ref 22–32)
Calcium: 8.7 mg/dL — ABNORMAL LOW (ref 8.9–10.3)
Chloride: 99 mmol/L — ABNORMAL LOW (ref 101–111)
Creatinine, Ser: 2.35 mg/dL — ABNORMAL HIGH (ref 0.61–1.24)
GFR calc Af Amer: 26 mL/min — ABNORMAL LOW (ref >60)
GFR calc non Af Amer: 22 mL/min — ABNORMAL LOW (ref >60)
Glucose, Bld: 109 mg/dL — ABNORMAL HIGH (ref 65–99)
Potassium: 3.6 mmol/L (ref 3.5–5.1)
Sodium: 141 mmol/L (ref 135–145)

## 2016-02-24 LAB — HEPARIN LEVEL (UNFRACTIONATED): Heparin Unfractionated: 0.55 IU/mL (ref 0.30–0.70)

## 2016-02-24 LAB — GLUCOSE, CAPILLARY
GLUCOSE-CAPILLARY: 151 mg/dL — AB (ref 65–99)
Glucose-Capillary: 120 mg/dL — ABNORMAL HIGH (ref 65–99)
Glucose-Capillary: 100 mg/dL — ABNORMAL HIGH (ref 65–99)
Glucose-Capillary: 72 mg/dL (ref 65–99)

## 2016-02-24 MED ORDER — CLOPIDOGREL BISULFATE 75 MG PO TABS
75.0000 mg | ORAL_TABLET | Freq: Every day | ORAL | Status: DC
  Administered 2016-02-24 – 2016-02-25 (×2): 75 mg via ORAL
  Filled 2016-02-24 (×2): qty 1

## 2016-02-24 MED ORDER — PERFLUTREN LIPID MICROSPHERE
1.0000 mL | Freq: Once | INTRAVENOUS | Status: AC
  Administered 2016-02-24: 2 mL via INTRAVENOUS
  Filled 2016-02-24: qty 10

## 2016-02-24 MED ORDER — POLYETHYLENE GLYCOL 3350 17 G PO PACK
17.0000 g | PACK | Freq: Every day | ORAL | Status: DC
  Filled 2016-02-24: qty 1

## 2016-02-24 MED ORDER — POLYETHYLENE GLYCOL 3350 17 G PO PACK
17.0000 g | PACK | Freq: Every day | ORAL | Status: DC | PRN
  Administered 2016-02-24: 17 g via ORAL

## 2016-02-24 MED ORDER — ISOSORBIDE MONONITRATE ER 30 MG PO TB24
30.0000 mg | ORAL_TABLET | Freq: Every day | ORAL | Status: DC
  Administered 2016-02-24 – 2016-02-25 (×2): 30 mg via ORAL
  Filled 2016-02-24 (×2): qty 1

## 2016-02-24 NOTE — Progress Notes (Signed)
  Echocardiogram 2D Echocardiogram has been performed.  Arvil ChacoFoster, Macrina Lehnert 02/24/2016, 12:18 PM

## 2016-02-24 NOTE — Progress Notes (Signed)
ANTICOAGULATION CONSULT NOTE - Follow Up Consult  Pharmacy Consult for Heparin Indication: chest pain/ACS and possible LV thrombus  Allergies  Allergen Reactions  . Codeine     unknown    Patient Measurements: Height: 5\' 8"  (172.7 cm) Weight: 181 lb 8 oz (82.328 kg) IBW/kg (Calculated) : 68.4  Vital Signs: Temp: 97.8 F (36.6 C) (06/07 0802) Temp Source: Oral (06/07 0802) BP: 115/67 mmHg (06/07 0802) Pulse Rate: 79 (06/07 0802)  Labs:  Recent Labs  02/22/16 1526 02/22/16 1841 02/23/16 0016 02/23/16 0644 02/24/16 0405  HGB 13.7  --  13.5  --  12.8*  HCT 44.6  --  43.4  --  40.8  PLT 166  --  137*  --  144*  LABPROT 15.7*  --   --   --   --   INR 1.23  --   --   --   --   HEPARINUNFRC  --   --  0.46 0.54 0.55  CREATININE 2.29*  --  2.31*  --  2.35*  TROPONINI  --  2.34* 1.77* 1.88*  --     Estimated Creatinine Clearance: 20.1 mL/min (by C-G formula based on Cr of 2.35).   Medications:  Heparin @ 1200 units/hr  Assessment: 89yom continues on heparin for NSTEMI and possible LV thrombus (ECHO pending). Heparin level is therapeutic at 0.55. Plan is for 48 hours of heparin (started 6/5 @ 1552) for his NSTEMI unless LV thrombus present then would need to continue. CBC stable. No bleeding noted.  Goal of Therapy:  Heparin level 0.3-0.7 units/ml Monitor platelets by anticoagulation protocol: Yes   Plan:  1) Continue heparin at 1200 units/hr 2) Daily heparin level and CBC 3) Follow up ECHO   Thank you for allowing us to participate in this patients care. Signe Coltonya C Jaslen Adcox, PharmD Pager: [phone]  02/24/2016,10:43 AM

## 2016-02-24 NOTE — Care Management Important Message (Signed)
Important Message  Patient Details  Name: Brady Glass MRN: [id number] Date of Birth: Jan 19, 1919   Medicare Important Message Given:  Yes    Coston Mandato Abena 02/24/2016, 10:14 AM

## 2016-02-24 NOTE — Progress Notes (Signed)
Patient: Brady Glass / Admit Date: 02/22/2016 / Date of Encounter: 02/24/2016, 8:57 AM   Subjective: Still notes DOE when he gets up and walks around, but SOB at rest is much better. Still continuing to urinate frequently on Lasix.   Objective: Telemetry: NSR Physical Exam: Blood pressure 115/67, pulse 79, temperature 97.8 F (36.6 C), temperature source Oral, resp. rate 15, height 5\' 8"  (1.727 m), weight 181 lb 8 oz (82.328 kg), SpO2 99 %. General: Well developed, well nourished WM, in no acute distress. Pleasantly conversant, engaging, appears younger than stated age Head: Normocephalic, atraumatic, sclera non-icteric, no xanthomas, nares are without discharge. Neck: JVP not elevated. Lungs: Rales left lung base, otherwise improving aeration. No wheezes, rales, or rhonchi. Breathing is unlabored. Heart: RRR S1 S2 without murmurs, rubs, or gallops.  Abdomen: Soft, non-tender, non-distended with normoactive bowel sounds. No rebound/guarding. Extremities: No clubbing or cyanosis. No edema. Venous stasis hyperpigmentation Distal pedal pulses are 2+ and equal bilaterally. Neuro: Alert and oriented X 3. Moves all extremities spontaneously. Psych: Responds to questions appropriately with a normal affect.   Intake/Output Summary (Last 24 hours) at 02/24/16 0857 Last data filed at 02/24/16 0500  Gross per 24 hour  Intake    900 ml  Output   1150 ml  Net   -250 ml    Inpatient Medications:  . albuterol  2.5 mg Nebulization TID  . aspirin EC  81 mg Oral Daily  . fenofibrate  54 mg Oral Daily  . folic acid  1 mg Oral Daily  . furosemide  80 mg Oral BID  . glipiZIDE  10 mg Oral Daily  . insulin aspart  0-15 Units Subcutaneous TID WC  . insulin aspart  0-5 Units Subcutaneous QHS  . insulin aspart  15 Units Subcutaneous BID WC  . levothyroxine  50 mcg Oral QAC breakfast  . metoprolol  50 mg Oral BID  . nitroGLYCERIN  0.5 inch Topical Q8H  . pantoprazole  40 mg Oral Daily  .  potassium chloride SA  20 mEq Oral Daily  . simvastatin  40 mg Oral QPM  . sodium chloride flush  3 mL Intravenous Q12H   Infusions:  . heparin 1,200 Units/hr (02/23/16 1213)    Labs:  Recent Labs  02/23/16 0016 02/24/16 0405  NA 141 141  K 3.9 3.6  CL 102 99*  CO2 28 28  GLUCOSE 173* 109*  BUN 45* 47*  CREATININE 2.31* 2.35*  CALCIUM 8.9 8.7*    Recent Labs  02/24/16 0405  AST 22  ALT 14*  ALKPHOS 30*  BILITOT 0.7  PROT 6.3*  ALBUMIN 3.1*    Recent Labs  02/22/16 1526 02/23/16 0016 02/24/16 0405  WBC 6.0 6.5 5.4  NEUTROABS 4.3  --   --   HGB 13.7 13.5 12.8*  HCT 44.6 43.4 40.8  MCV 103.0* 104.6* 101.5*  PLT 166 137* 144*    Recent Labs  02/22/16 1841 02/23/16 0016 02/23/16 0644  TROPONINI 2.34* 1.77* 1.88*   Invalid input(s): POCBNP No results for input(s): HGBA1C in the last 72 hours.   Radiology/Studies:  Dg Chest 2 View  02/22/2016  CLINICAL DATA:  Shortness of breath, coronary disease, hypertension, diabetes mellitus, patient suspects having an MI EXAM: CHEST  2 VIEW COMPARISON:  None currently available; prior exam on the time line from 09/11/2013 does not load for comparison. FINDINGS: Enlargement of cardiac silhouette. Atherosclerotic calcification aorta. Mediastinal contours and pulmonary vascularity normal. Bronchitic changes with minimal bibasilar atelectasis. Elevation  of RIGHT diaphragm. Small BILATERAL pleural effusions. No definite acute infiltrate or pneumothorax. Bones demineralized with ankylosis of the thoracic spine. BILATERAL glenohumeral degenerative changes with posttraumatic deformity of the proximal LEFT humerus, appears old. IMPRESSION: Chronic bronchitic changes with bibasilar atelectasis and small BILATERAL pleural effusions. Electronically Signed   By: Ulyses Southward M.D.   On: 02/22/2016 16:38     Assessment and Plan  86M with CAD (s/p PCI/stenting x 3 in 1995, stenting x 1 in 2002), DM, HTN, HLD, CKD stage III (Cr 1.9 in  12/2015 per PCP's office) admitted with SOB and abnormal echo - EF 20% with apical AK and a possible mural thrombus, RV pressure 67 (previously 60-65% with normal WM in 07/2014). Found to have NSTEMI, AKI on CKD.  1. NSTEMI with prior h/o CAD - continue ASA, statin, BB. Change NTG paste to Imdur. Await echo before discontinuing heparin given question of LV thrombus. If no LV thrombus present, plan to add Plavix for medical therapy of NSTEMI. Patient does not want to risk CIN or the possibility of requiring HD, thus will continue to manage medically instead of cath.  2. Acute systolic CHF - consider changing Lopressor to carvedilol or Toprol. Continues to urinate frequently on current dose of Lasix. Rales still present in L base but improving. Follow.  3. Possible LV thrombus - repeat echocardiogram pending with definity to further eval for LV thrombus. Continue IV heparin. Nurse is going to call echo to see if we can expedite.  4. AKI on CKD stage III - Cr was 1.9 in 12/2015 so we are not significantly far off from baseline. Follow on diuretics.  5. HTN - controlled.  6. Diabetes mellitus - continue current regimen.  6. Hyperlipidemia - continue statin and fenofibrate.   Signed, Ronie Spies PA-C Pager: [phone]

## 2016-02-25 ENCOUNTER — Telehealth: Payer: Self-pay | Admitting: Nurse Practitioner

## 2016-02-25 DIAGNOSIS — E1122 Type 2 diabetes mellitus with diabetic chronic kidney disease: Secondary | ICD-10-CM

## 2016-02-25 DIAGNOSIS — N183 Chronic kidney disease, stage 3 (moderate): Secondary | ICD-10-CM

## 2016-02-25 DIAGNOSIS — Z794 Long term (current) use of insulin: Secondary | ICD-10-CM

## 2016-02-25 LAB — BASIC METABOLIC PANEL
ANION GAP: 10 (ref 5–15)
BUN: 42 mg/dL — ABNORMAL HIGH (ref 6–20)
CHLORIDE: 98 mmol/L — AB (ref 101–111)
CO2: 30 mmol/L (ref 22–32)
Calcium: 8.9 mg/dL (ref 8.9–10.3)
Creatinine, Ser: 2.33 mg/dL — ABNORMAL HIGH (ref 0.61–1.24)
GFR calc non Af Amer: 22 mL/min — ABNORMAL LOW (ref >60)
GFR, EST AFRICAN AMERICAN: 26 mL/min — AB (ref >60)
GLUCOSE: 71 mg/dL (ref 65–99)
Potassium: 3.8 mmol/L (ref 3.5–5.1)
Sodium: 138 mmol/L (ref 135–145)

## 2016-02-25 LAB — GLUCOSE, CAPILLARY
GLUCOSE-CAPILLARY: 39 mg/dL — AB (ref 65–99)
GLUCOSE-CAPILLARY: 150 mg/dL — AB (ref 65–99)
Glucose-Capillary: 101 mg/dL — ABNORMAL HIGH (ref 65–99)
Glucose-Capillary: 76 mg/dL (ref 65–99)

## 2016-02-25 LAB — CBC
HEMATOCRIT: 40.8 % (ref 39.0–52.0)
HEMOGLOBIN: 12.9 g/dL — AB (ref 13.0–17.0)
MCH: 32.2 pg (ref 26.0–34.0)
MCHC: 31.6 g/dL (ref 30.0–36.0)
MCV: 101.7 fL — AB (ref 78.0–100.0)
Platelets: 134 10*3/uL — ABNORMAL LOW (ref 150–400)
RBC: 4.01 MIL/uL — ABNORMAL LOW (ref 4.22–5.81)
RDW: 14.3 % (ref 11.5–15.5)
WBC: 5.3 10*3/uL (ref 4.0–10.5)

## 2016-02-25 MED ORDER — NITROGLYCERIN 0.4 MG SL SUBL: 0.4000 mg | SUBLINGUAL_TABLET | SUBLINGUAL | Status: AC | PRN

## 2016-02-25 MED ORDER — METOPROLOL SUCCINATE ER 100 MG PO TB24: 100.0000 mg | ORAL_TABLET | Freq: Every day | ORAL | Status: DC

## 2016-02-25 MED ORDER — FUROSEMIDE 80 MG PO TABS: 80.0000 mg | ORAL_TABLET | Freq: Two times a day (BID) | ORAL | Status: AC

## 2016-02-25 MED ORDER — CLOPIDOGREL BISULFATE 75 MG PO TABS: 75.0000 mg | ORAL_TABLET | Freq: Every day | ORAL | Status: AC

## 2016-02-25 MED ORDER — METOPROLOL SUCCINATE ER 100 MG PO TB24: 100.0000 mg | ORAL_TABLET | Freq: Every day | ORAL | Status: AC

## 2016-02-25 MED ORDER — ISOSORBIDE MONONITRATE ER 30 MG PO TB24: 30.0000 mg | ORAL_TABLET | Freq: Every day | ORAL | Status: AC

## 2016-02-25 MED ORDER — PANTOPRAZOLE SODIUM 40 MG PO TBEC: 40.0000 mg | DELAYED_RELEASE_TABLET | Freq: Every day | ORAL | Status: AC

## 2016-02-25 NOTE — Discharge Summary (Signed)
Discharge Summary    Patient ID: Brady Glass,  MRN: [id number], DOB/AGE: 80-Feb-1920 80 y.o.  Admit date: 02/22/2016 Discharge date: 02/25/2016  Primary Care Provider: Karle PlumberARVIND,MOOGALI Glass Primary Cardiologist: Dr. Allyson SabalBerry  Discharge Diagnoses    Principal Problem:   Acute systolic (congestive) heart failure (HCC) Active Problems:   CAD S/P remote PCI   Ischemic cardiomyopathy   NSTEMI (non-ST elevated myocardial infarction) (HCC)   Type 2 diabetes mellitus with stage 3 chronic kidney disease (HCC)   Hypothyroidism   EKG abnormalities- new   Acute renal failure superimposed on stage 3 chronic kidney disease (HCC)   Essential hypertension   Hyperlipidemia   Allergies Allergies  Allergen Reactions  . Codeine     unknown    Diagnostic Studies/Procedures  Transthoracic Echocardiography 02/24/16 Study Conclusions  - Left ventricle: The cavity size was normal. There was mild focal  basal hypertrophy of the septum. Systolic function was severely  reduced. The estimated ejection fraction was in the range of 20%  to 25%. There is akinesis of the entireinferolateral and apical  myocardium. There is akinesis of the mid-apicalanterior, lateral,  and inferior myocardium. There is akinesis of the midanteroseptal  and inferoseptal myocardium. - Aortic valve: Moderately calcified annulus. Trileaflet. Mild  diffuse thickening and calcification. There was mild  regurgitation. - Mitral valve: Calcified annulus. There was mild to moderate  regurgitation. - Left atrium: The atrium was mildly dilated. - Right ventricle: The cavity size was moderately dilated. Wall  thickness was normal. - Right atrium: The atrium was moderately dilated. - Atrial septum: A patent foramen ovale cannot be excluded. - Tricuspid valve: There was moderate regurgitation. - Pulmonic valve: There was mild regurgitation. - Pulmonary arteries: PA peak pressure: 51 mm Hg (S).  Impressions:  - The  right ventricular systolic pressure was increased consistent  with moderate pulmonary hypertension. _____________   History of Present Illness   Mr. Brady ManchesterSoriano is a pleasant 82 y/o male with a history of CAD. He is s/p PCI and stenting x 3 in 1995, followed by PCI with stenting x 1 in 2002 [facility] [place]. He moved her in 2006 and has been followed by Dr Hanley Haysosario at [facility]. The pt has had echos and stress test done and apparently he has done well. This past Saturday (02/20/16) he noted increased DOE and his son took him to [facility]. His EKG was abnormal so he was seen by Dr. Hanley Haysosario on 02/22/16. An echo done in Dr Rosario's office showed the pt's EF to be 20% with apical AK and a possible mural thrombus. RV pressure was 67 mmHg. Pt's Nov 2015 echo- EF 60-65% with normal WM. The pt was initially sent to [facility] but there was some mix up and the son called and spoke to Arnette FeltsMike Duran, [place] ( a friend of Dr. Allyson SabalBerry) who then spoke to Dr Allyson SabalBerry who agreed to admit the patient.  Hospital Course  His troponin was elevated at 2.34, However patient did not want heart catheterization as he has chronic kidney disease and does not want to risk requiring dialysis. He will be medically managed with aspirin and Plavix.  His echo at [facility] showed possible LV thrombus ,he underwent repeat echo during this admission and there was no apical thrombus seen on echo. His EF was reduced at 20-25%. He has akinesis of the entire inferior lateral and apical myocardium. There is also akinesis of the mid apical anterior, lateral  and inferior myocardium. He will be on Lasix 80mg   BID, metoprolol XL 100mg . He is not on ACE/ARB or Spironolactone due to renal failure.   He will continue metoprolol and isosorbide for his hypertensive heart disease. He has walked with the nurses and had no chest pain or SOB . He was seen today by Dr. Duke Salvia and deemed suitable for discharge.    _____________  Discharge Vitals Blood pressure 128/76, pulse 71, temperature 99 F (37.2 C), temperature source Oral, resp. rate 15, height 5\' 8"  (1.727 Glass), weight 180 lb 3.2 oz (81.738 kg), SpO2 95 %.  Filed Weights   02/23/16 0500 02/24/16 0459 02/25/16 0300  Weight: 181 lb 11.2 oz (82.419 kg) 181 lb 8 oz (82.328 kg) 180 lb 3.2 oz (81.738 kg)    Labs & Radiologic Studies     CBC  Recent Labs  02/24/16 0405 02/25/16 0440  WBC 5.4 5.3  HGB 12.8* 12.9*  HCT 40.8 40.8  MCV 101.5* 101.7*  PLT 144* 134*   Basic Metabolic Panel  Recent Labs  02/24/16 0405 02/25/16 0440  NA 141 138  K 3.6 3.8  CL 99* 98*  CO2 28 30  GLUCOSE 109* 71  BUN 47* 42*  CREATININE 2.35* 2.33*  CALCIUM 8.7* 8.9   Liver Function Tests  Recent Labs  02/24/16 0405  AST 22  ALT 14*  ALKPHOS 30*  BILITOT 0.7  PROT 6.3*  ALBUMIN 3.1*  Cardiac Enzymes  Recent Labs  02/22/16 1841 02/23/16 0016 02/23/16 0644  TROPONINI 2.34* 1.77* 1.88*   Fasting Lipid Panel  Recent Labs  02/24/16 0405  CHOL 114  HDL 38*  LDLCALC 53  TRIG 834  CHOLHDL 3.0   Thyroid Function Tests  Recent Labs  02/22/16 1841  TSH 2.329    Dg Chest 2 View  02/22/2016  CLINICAL DATA:  Shortness of breath, coronary disease, hypertension, diabetes mellitus, patient suspects having an MI EXAM: CHEST  2 VIEW COMPARISON:  None currently available; prior exam on the time line from 09/11/2013 does not load for comparison. FINDINGS: Enlargement of cardiac silhouette. Atherosclerotic calcification aorta. Mediastinal contours and pulmonary vascularity normal. Bronchitic changes with minimal bibasilar atelectasis. Elevation of RIGHT diaphragm. Small BILATERAL pleural effusions. No definite acute infiltrate or pneumothorax. Bones demineralized with ankylosis of the thoracic spine. BILATERAL glenohumeral degenerative changes with posttraumatic deformity of the proximal LEFT humerus, appears old. IMPRESSION: Chronic bronchitic  changes with bibasilar atelectasis and small BILATERAL pleural effusions. Electronically Signed   By: Ulyses Southward Glass.D.   On: 02/22/2016 16:38    Disposition   Pt is being discharged home today in good condition.  Follow-up Plans & Appointments    Follow-up Information    Follow up with Nicolasa Ducking, NP On 03/03/2016.   Specialties:  Nurse Practitioner, Cardiology, Radiology   Why:  at 10:30 for follow up    Contact information:   [address] [place] [address] [phone]      Discharge Instructions    Diet - low sodium heart healthy    Complete by:  As directed      Increase activity slowly    Complete by:  As directed            Discharge Medications   Current Discharge Medication List    START taking these medications   Details  clopidogrel (PLAVIX) 75 MG tablet Take 1 tablet (75 mg total) by mouth daily. Qty: 30 tablet, Refills: 12    isosorbide  mononitrate (IMDUR) 30 MG 24 hr tablet Take 1 tablet (30 mg total) by mouth daily. Qty: 30 tablet, Refills: 12    metoprolol succinate (TOPROL-XL) 100 MG 24 hr tablet Take 1 tablet (100 mg total) by mouth daily. Take with or immediately following a meal. Qty: 30 tablet, Refills: 12    nitroGLYCERIN (NITROSTAT) 0.4 MG SL tablet Place 1 tablet (0.4 mg total) under the tongue every 5 (five) minutes as needed for chest pain. Qty: 25 tablet, Refills: 1    pantoprazole (PROTONIX) 40 MG tablet Take 1 tablet (40 mg total) by mouth daily. Qty: 30 tablet, Refills: 12      CONTINUE these medications which have CHANGED   Details  furosemide (LASIX) 80 MG tablet Take 1 tablet (80 mg total) by mouth 2 (two) times daily. Qty: 60 tablet, Refills: 12      CONTINUE these medications which have NOT CHANGED   Details  albuterol (PROVENTIL HFA;VENTOLIN HFA) 108 (90 Base) MCG/ACT inhaler Inhale 2 puffs into the lungs 3 (three) times daily.    aspirin 81 MG tablet Take 81 mg by mouth daily.      Cholecalciferol (VITAMIN D) 2000 units CAPS Take 2,000 Units by mouth daily.    Coenzyme Q10 (CO Q 10 PO) Take 1 tablet by mouth daily.    fenofibrate micronized (ANTARA) 130 MG capsule Take 130 mg by mouth daily.    folic acid (FOLVITE) 400 MCG tablet Take 800 mcg by mouth daily.    glipiZIDE (GLUCOTROL XL) 10 MG 24 hr tablet Take 10 mg by mouth daily.    insulin aspart (NOVOLOG) 100 UNIT/ML injection Inject 15 Units into the skin 2 (two) times daily.    levothyroxine (SYNTHROID, LEVOTHROID) 50 MCG tablet Take 50 mcg by mouth daily.    Multiple Vitamins-Minerals (MULTIVITAL PO) Take 1 tablet by mouth daily. MegaRed    potassium chloride SA (K-DUR,KLOR-CON) 20 MEQ tablet Take 20 mEq by mouth daily.    simvastatin (ZOCOR) 40 MG tablet Take 40 mg by mouth every evening.    vitamin B-12 (CYANOCOBALAMIN) 1000 MCG tablet Take 1,000 mcg by mouth daily.      STOP taking these medications     cefdinir (OMNICEF) 300 MG capsule      ibuprofen (ADVIL,MOTRIN) 400 MG tablet      meloxicam (MOBIC) 7.5 MG tablet      metoprolol (LOPRESSOR) 100 MG tablet      omeprazole (PRILOSEC) 40 MG capsule          Aspirin prescribed at discharge? Yes High Intensity Statin Prescribed? Yes Beta Blocker Prescribed?Yes For EF 45% or less, Was ACEI/ARB Prescribed? No, pt. Has CKD.  ADP Receptor Inhibitor Prescribed? Yes For EF <40%, Aldosterone Inhibitor Prescribed?no  Was EF assessed during THIS hospitalization?Yes  Was Cardiac Rehab II ordered? (Included Medically managed Patients): Yes   Outstanding Labs/Studies    Duration of Discharge Encounter   Greater than 30 minutes including physician time.  Signed, Little Ishikawa NP 02/25/2016, 5:00 PM

## 2016-02-25 NOTE — Evaluation (Signed)
Physical Therapy Evaluation Patient Details Name: Brady Glass MRN: [id number] DOB: Nov 01, 1918 Today's Date: 02/25/2016   History of Present Illness  30M with CAD s/p PCI, DM, hypertension, hyperlipidemia,CKD 3, and chronic systolic and diastolic heart failure admitted with non-ST elevation myocardial infarction, acute on chronic kidney disease, and possible apical thrombus.  Clinical Impression  Patient seen for mobility assessment. Patient mobilizing well with use of SPC. Patient recently mobilized on room air with nsg staff and reports some instability upon fatigue. Ambulated with this therapist and again demonstrated modest instability but no need for physical assist. Saturations >90% on room air throughout. HR did elevate to 120s with activity. Spoke with patient regarding energy conservation and mobility expectations upon discharge. No further acute PT needs, no follow up at this time as patient is at baseline. Will sign off. Patient in agreement.     Follow Up Recommendations No PT follow up    Equipment Recommendations  None recommended by PT    Recommendations for Other Services       Precautions / Restrictions Restrictions Weight Bearing Restrictions: No      Mobility  Bed Mobility Overal bed mobility: Modified Independent             General bed mobility comments: increased time to perfrom  Transfers Overall transfer level: Modified independent Equipment used: Straight cane             General transfer comment: Modest instability upon coming to standing, no physical assist required  Ambulation/Gait Ambulation/Gait assistance: Supervision Ambulation Distance (Feet): 80 Feet Assistive device: Straight cane Gait Pattern/deviations: Decreased stride length;Drifts right/left;Wide base of support Gait velocity: decreased Gait velocity interpretation: Below normal speed for age/gender General Gait Details: patient ambulated on room air 80 ft with HR  elevating to 120s. Patient had just completed extended ambulation with nursing staff and reports some modest instability upon fatigue. During ambulation with this therapist patient with one noted LOB but able to self correct without physical assist  Stairs            Wheelchair Mobility    Modified Rankin (Stroke Patients Only)       Balance Overall balance assessment: Needs assistance   Sitting balance-Leahy Scale: Good       Standing balance-Leahy Scale: Fair Standing balance comment: reliance on assistive device             High level balance activites: Direction changes;Turns;Head turns High Level Balance Comments: supervision to min guard with higher level balance tasks. Some instability noted.              Pertinent Vitals/Pain Pain Assessment: No/denies pain    Home Living Family/patient expects to be discharged to:: Private residence Living Arrangements: Children (son and daughter-in-law) Available Help at Discharge: Family Type of Home: House Home Access: Level entry     Home Layout: Two level Home Equipment: Environmental consultantWalker - 2 wheels;Cane - single point;Other (comment) (chair life) Additional Comments: patient uses the chair lift to go up and down the steps to the 2nd floor    Prior Function Level of Independence: Independent with assistive device(s)         Comments: has family that assists with meals at home sometimes     Hand Dominance   Dominant Hand: Right    Extremity/Trunk Assessment   Upper Extremity Assessment: Overall WFL for tasks assessed           Lower Extremity Assessment: Overall WFL for tasks assessed  Communication   Communication: HOH  Cognition Arousal/Alertness: Awake/alert Behavior During Therapy: WFL for tasks assessed/performed Overall Cognitive Status: Within Functional Limits for tasks assessed                      General Comments General comments (skin integrity, edema, etc.): spoke  with patient at length regarding energy conservation and mobility expectations upon discharge    Exercises        Assessment/Plan    PT Assessment Patent does not need any further PT services  PT Diagnosis Difficulty walking   PT Problem List    PT Treatment Interventions     PT Goals (Current goals can be found in the Care Plan section) Acute Rehab PT Goals PT Goal Formulation: All assessment and education complete, DC therapy    Frequency     Barriers to discharge        Co-evaluation               End of Session Equipment Utilized During Treatment: Gait belt Activity Tolerance: Patient tolerated treatment well Patient left: in bed;with call bell/phone within reach Nurse Communication: Mobility status         Time: 1610-9604 PT Time Calculation (min) (ACUTE ONLY): 14 min   Charges:   PT Evaluation $PT Eval Low Complexity: 1 Procedure     PT G CodesFabio Asa March 05, 2016, 4:02 PM  Charlotte Crumb, PT DPT  [phone]

## 2016-02-25 NOTE — Discharge Instructions (Signed)
Low-Sodium Eating Plan °Sodium raises blood pressure and causes water to be held in the body. Getting less sodium from food will help lower your blood pressure, reduce any swelling, and protect your heart, liver, and kidneys. We get sodium by adding salt (sodium chloride) to food. Most of our sodium comes from canned, boxed, and frozen foods. Restaurant foods, fast foods, and pizza are also very high in sodium. Even if you take medicine to lower your blood pressure or to reduce fluid in your body, getting less sodium from your food is important. °WHAT IS MY PLAN? °Most people should limit their sodium intake to 2,300 mg a day. Your health care provider recommends that you limit your sodium intake to __________ a day.  °WHAT DO I NEED TO KNOW ABOUT THIS EATING PLAN? °For the low-sodium eating plan, you will follow these general guidelines: °· Choose foods with a % Daily Value for sodium of less than 5% (as listed on the food label).   °· Use salt-free seasonings or herbs instead of table salt or sea salt.   °· Check with your health care provider or pharmacist before using salt substitutes.   °· Eat fresh foods. °· Eat more vegetables and fruits. °· Limit canned vegetables. If you do use them, rinse them well to decrease the sodium.   °· Limit cheese to 1 oz (28 g) per day.    °· Eat lower-sodium products, often labeled as "lower sodium" or "no salt added." °· Avoid foods that contain monosodium glutamate (MSG). MSG is sometimes added to Chinese food and some canned foods.   °· Check food labels (Nutrition Facts labels) on foods to learn how much sodium is in one serving. °· Eat more home-cooked food and less restaurant, buffet, and fast food.  °· When eating at a restaurant, ask that your food be prepared with less salt, or no salt if possible.   °HOW DO I READ FOOD LABELS FOR SODIUM INFORMATION? °The Nutrition Facts label lists the amount of sodium in one serving of the food. If you eat more than one serving, you  must multiply the listed amount of sodium by the number of servings. °Food labels may also identify foods as: °· Sodium free--Less than 5 mg in a serving. °· Very low sodium--35 mg or less in a serving. °· Low sodium--140 mg or less in a serving. °· Light in sodium--50% less sodium in a serving. For example, if a food that usually has 300 mg of sodium is changed to become light in sodium, it will have 150 mg of sodium. °· Reduced sodium--25% less sodium in a serving. For example, if a food that usually has 400 mg of sodium is changed to reduced sodium, it will have 300 mg of sodium. °WHAT FOODS CAN I EAT? °Grains  °Low-sodium cereals, including oats, puffed wheat and rice, and shredded wheat cereals. Low-sodium crackers. Unsalted rice and pasta. Lower-sodium bread.  °Vegetables  °Frozen or fresh vegetables. Low-sodium or reduced-sodium canned vegetables. Low-sodium or reduced-sodium tomato sauce and paste. Low-sodium or reduced-sodium tomato and vegetable juices.  °Fruits  °Fresh, frozen, and canned fruit. Fruit juice.  °Meat and Other Protein Products  °Low-sodium canned tuna and salmon. Fresh or frozen meat, poultry, seafood, and fish. Lamb. Unsalted nuts. Dried beans, peas, and lentils without added salt. Unsalted canned beans. Homemade soups without salt. Eggs.  °Dairy  °Milk. Soy milk. Ricotta cheese. Low-sodium or reduced-sodium cheeses. Yogurt.  °Condiments  °Fresh and dried herbs and spices. Salt-free seasonings. Onion and garlic powders. Low-sodium varieties of mustard and ketchup. Fresh or refrigerated horseradish. Lemon   juice.  °Fats and Oils   °Reduced-sodium salad dressings. Unsalted butter.   °Other  °Unsalted popcorn and pretzels.  °The items listed above may not be a complete list of recommended foods or beverages. Contact your dietitian for more options. °WHAT FOODS ARE NOT RECOMMENDED? °Grains  °Instant hot cereals. Bread stuffing, pancake, and biscuit mixes. Croutons. Seasoned rice or pasta mixes.  Noodle soup cups. Boxed or frozen macaroni and cheese. Self-rising flour. Regular salted crackers. °Vegetables  °Regular canned vegetables. Regular canned tomato sauce and paste. Regular tomato and vegetable juices. Frozen vegetables in sauces. Salted French fries. Olives. Pickles. Relishes. Sauerkraut. Salsa. °Meat and Other Protein Products  °Salted, canned, smoked, spiced, or pickled meats, seafood, or fish. Bacon, ham, sausage, hot dogs, corned beef, chipped beef, and packaged luncheon meats. Salt pork. Jerky. Pickled herring. Anchovies, regular canned tuna, and sardines. Salted nuts. °Dairy  °Processed cheese and cheese spreads. Cheese curds. Blue cheese and cottage cheese. Buttermilk.  °Condiments  °Onion and garlic salt, seasoned salt, table salt, and sea salt. Canned and packaged gravies. Worcestershire sauce. Tartar sauce. Barbecue sauce. Teriyaki sauce. Soy sauce, including reduced sodium. Steak sauce. Fish sauce. Oyster sauce. Cocktail sauce. Horseradish that you find on the shelf. Regular ketchup and mustard. Meat flavorings and tenderizers. Bouillon cubes. Hot sauce. Tabasco sauce. Marinades. Taco seasonings. Relishes. °Fats and Oils   °Regular salad dressings. Salted butter. Margarine. Ghee. Bacon fat.  °Other  °Potato and tortilla chips. Corn chips and puffs. Salted popcorn and pretzels. Canned or dried soups. Pizza. Frozen entrees and pot pies.   °The items listed above may not be a complete list of foods and beverages to avoid. Contact your dietitian for more information. °  °This information is not intended to replace advice given to you by your health care provider. Make sure you discuss any questions you have with your health care provider. °  °Document Released: 02/25/2002 Document Revised: 09/26/2014 Document Reviewed: 07/10/2013 °Elsevier Interactive Patient Education ©2016 [employer] ° °

## 2016-02-25 NOTE — Telephone Encounter (Signed)
Patient currently admitted on 02/25/16

## 2016-02-25 NOTE — Progress Notes (Signed)
PATIENT ID: [id number] with CAD s/p PCI, DM, hypertension, hyperlipidemia,CKD 3, and chronic systolic and diastolic heart failure admitted with non-ST elevation myocardial infarction, acute on chronic kidney disease, and possible apical thrombus.  SUBJECTIVE:  Feeling well. Anxious to go home.   PHYSICAL EXAM Filed Vitals:   02/25/16 0753 02/25/16 0913 02/25/16 1141 02/25/16 1446  BP: 109/57  128/76   Pulse:   71   Temp: 98.6 F (37 C)  99 F (37.2 C)   TempSrc: Oral  Oral   Resp: 23  15   Height:      Weight:      SpO2: 94% 97% 95% 95%   General:  Well-appearing in no acute distress..   Neck: No JVD. Lungs:  Clear to auscultation bilaterally. No crackles, rhonchi, or wheezes.  Heart:  Regular rate and rhythm. No murmurs, rubs, or gallops. Abdomen:  Soft. Nontender, nondistended. Active bowel sounds. Extremities:  WWP.  No edema.   LABS: Lab Results  Component Value Date   TROPONINI 1.88* 02/23/2016   Results for orders placed or performed during the hospital encounter of 02/22/16 (from the past 24 hour(s))  Glucose, capillary     Status: None   Collection Time: 02/24/16  4:17 PM  Result Value Ref Range   Glucose-Capillary 72 65 - 99 mg/dL  Glucose, capillary     Status: Abnormal   Collection Time: 02/24/16  7:31 PM  Result Value Ref Range   Glucose-Capillary 151 (H) 65 - 99 mg/dL  CBC     Status: Abnormal   Collection Time: 02/25/16  4:40 AM  Result Value Ref Range   WBC 5.3 4.0 - 10.5 K/uL   RBC 4.01 (L) 4.22 - 5.81 MIL/uL   Hemoglobin 12.9 (L) 13.0 - 17.0 g/dL   HCT 16.1 09.6 - 04.5 %   MCV 101.7 (H) 78.0 - 100.0 fL   MCH 32.2 26.0 - 34.0 pg   MCHC 31.6 30.0 - 36.0 g/dL   RDW 40.9 81.1 - 91.4 %   Platelets 134 (L) 150 - 400 K/uL  Basic metabolic panel     Status: Abnormal   Collection Time: 02/25/16  4:40 AM  Result Value Ref Range   Sodium 138 135 - 145 mmol/L   Potassium 3.8 3.5 - 5.1 mmol/L   Chloride 98 (L) 101 - 111 mmol/L   CO2 30 22 - 32 mmol/L   Glucose, Bld 71 65 - 99 mg/dL   BUN 42 (H) 6 - 20 mg/dL   Creatinine, Ser 7.82 (H) 0.61 - 1.24 mg/dL   Calcium 8.9 8.9 - 95.6 mg/dL   GFR calc non Af Amer 22 (L) >60 mL/min   GFR calc Af Amer 26 (L) >60 mL/min   Anion gap 10 5 - 15  Glucose, capillary     Status: Abnormal   Collection Time: 02/25/16  7:28 AM  Result Value Ref Range   Glucose-Capillary 101 (H) 65 - 99 mg/dL  Glucose, capillary     Status: Abnormal   Collection Time: 02/25/16 11:31 AM  Result Value Ref Range   Glucose-Capillary 39 (LL) 65 - 99 mg/dL   Comment 1 Notify RN   Glucose, capillary     Status: None   Collection Time: 02/25/16 12:06 PM  Result Value Ref Range   Glucose-Capillary 76 65 - 99 mg/dL    Intake/Output Summary (Last 24 hours) at 02/25/16 1456 Last data filed at 02/25/16 0844  Gross per 24 hour  Intake  590 ml  Output   1500 ml  Net   -910 ml    Telemetry: Sinus rhythm with PACs.   Echo 02/24/16: Study Conclusions  - Left ventricle: The cavity size was normal. There was mild focal  basal hypertrophy of the septum. Systolic function was severely  reduced. The estimated ejection fraction was in the range of 20%  to 25%. There is akinesis of the entireinferolateral and apical  myocardium. There is akinesis of the mid-apicalanterior, lateral,  and inferior myocardium. There is akinesis of the midanteroseptal  and inferoseptal myocardium. - Aortic valve: Moderately calcified annulus. Trileaflet. Mild  diffuse thickening and calcification. There was mild  regurgitation. - Mitral valve: Calcified annulus. There was mild to moderate  regurgitation. - Left atrium: The atrium was mildly dilated. - Right ventricle: The cavity size was moderately dilated. Wall  thickness was normal. - Right atrium: The atrium was moderately dilated. - Atrial septum: A patent foramen ovale cannot be excluded. - Tricuspid valve: There was moderate regurgitation. - Pulmonic valve: There was mild  regurgitation. - Pulmonary arteries: PA peak pressure: 51 mm Hg (S).  Impressions:  - The right ventricular systolic pressure was increased consistent  with moderate pulmonary hypertension.  ASSESSMENT AND PLAN:  Principal Problem:   Acute systolic (congestive) heart failure (HCC) Active Problems:   CAD S/P remote PCI   Ischemic cardiomyopathy   NSTEMI (non-ST elevated myocardial infarction) (HCC)   Type 2 diabetes mellitus with stage 3 chronic kidney disease (HCC)   Hypothyroidism   EKG abnormalities- new   Acute renal failure superimposed on stage 3 chronic kidney disease (HCC)   Essential hypertension   Hyperlipidemia   # Acute on chronic systolic and diastolic heart failure:  No apical thrombus was seen on echo.  [name] is now euvolemic on exam.  Continue lasix 80 mg bid (inreased this admission).  Consolidate metoprolol to metoprolol succinate 100 mg daily.  He is not on an ACE-I/ARB or spironolactone due to renal failure.  No ICD given that he is DNAR.   # CAD, NSTEMI: Medically manage with aspirin, metoprolol, and simvastatin. Imdur was added this ho he has ambulated without exertional chest pain or shortness of breath. spitalization.    # Hypertensive heart disease: Blood pressure well-controlled with metoprolol and Imdur.   # Hyperlipidemia: Continue simvastatin.  LDL 53.   Kailer Heindel C. Duke Salviaandolph, MD, [facility] 02/25/2016 2:56 PM

## 2016-02-25 NOTE — Progress Notes (Signed)
OT Cancellation Note  Patient Details Name: Brady Glass MRN: [id number] DOB: [birth year]   Cancelled Treatment:    Reason Eval/Treat Not Completed: OT screened, no needs identified, will sign off. Spoke with PT, pt functioning at his baseline. Pt has no DME needs.   Nils PyleJulia Jamiaya Bina, OTR/L Pager: [phone] 02/25/2016, 4:10 PM

## 2016-02-25 NOTE — Telephone Encounter (Signed)
New message      TCM appt on 03-03-16 with Ward Givenshris Berge per Suzzette RighterErin Smith.

## 2016-02-26 NOTE — Telephone Encounter (Signed)
[facility] on 02/25/16.   Spoke to patient son.  Patient is following up with his Arnette FeltsMike Duran, [facility] (the office of his primary Cardiologist, Dr. Hanley Haysosario). Patient understands discharge instructions? yes  Patient understands medications and regiment? yes   Pt son states that the appt with Nicolasa Duckinghristopher Berge, NP was cancelled because they wanted to follow up at primary cardiologist office. his breathing is better and his BP has been good. He also states that he was very pleased with Dr. Allyson SabalBerry and the service at [facility] and will continue to go there if needed.

## 2016-03-03 ENCOUNTER — Encounter: Payer: Medicare HMO | Admitting: Nurse Practitioner

## 2016-05-20 DEATH — deceased

## 2016-06-27 IMAGING — DX DG CHEST 2V
2 series · 2 of 2 positions shown · non-contrast
Comparison: None currently available; prior exam on the time line
from 09/11/2013 does not load for comparison.

CLINICAL DATA: Shortness of breath, coronary disease, hypertension,
diabetes mellitus, patient suspects having an MI

EXAM:
CHEST  2 VIEW

[chest pa]
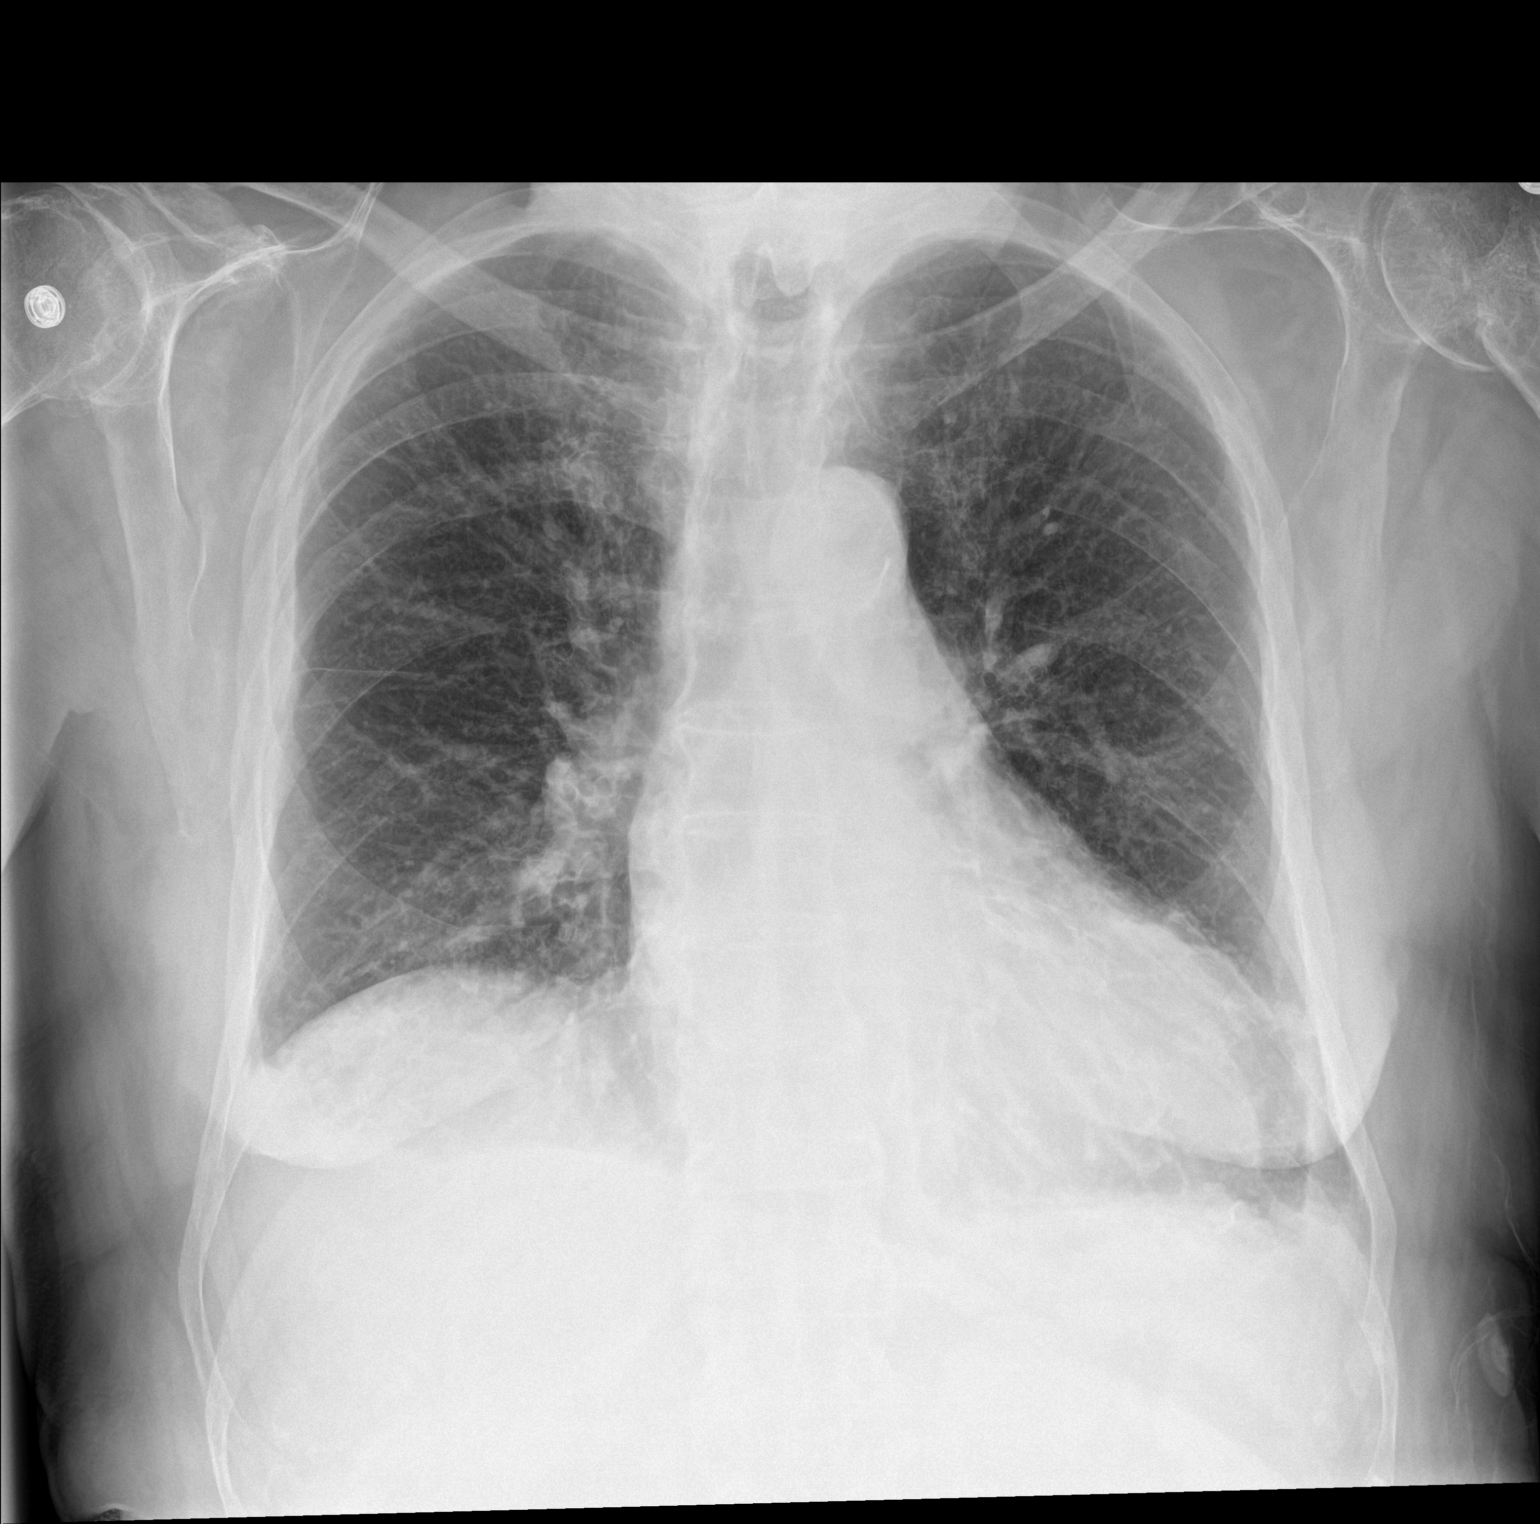

[chest lat]
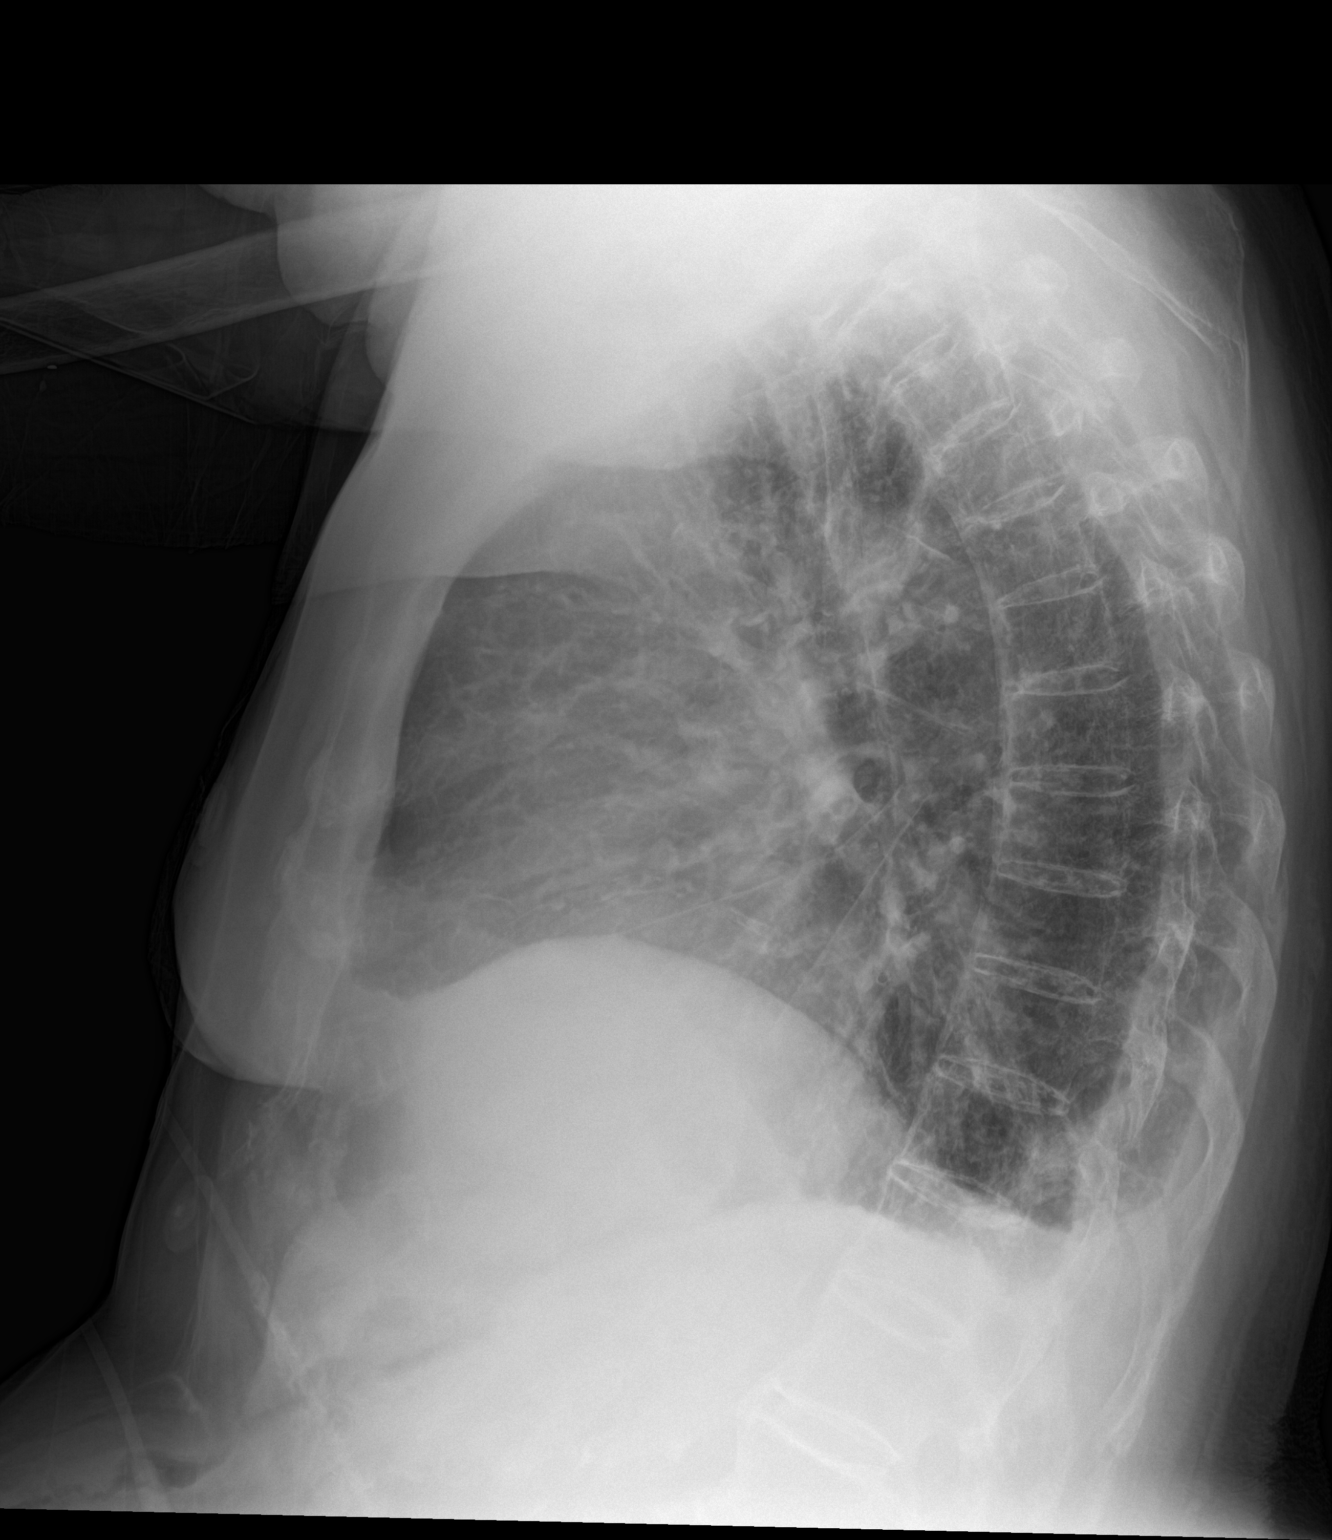

[2 of 2 positions shown; findings below may reference images not displayed]

FINDINGS: Enlargement of cardiac silhouette.

Atherosclerotic calcification aorta.

Mediastinal contours and pulmonary vascularity normal.

Bronchitic changes with minimal bibasilar atelectasis.

Elevation of RIGHT diaphragm.

Small BILATERAL pleural effusions.

No definite acute infiltrate or pneumothorax.

Bones demineralized with ankylosis of the thoracic spine.

BILATERAL glenohumeral degenerative changes with posttraumatic
deformity of the proximal LEFT humerus, appears old.
IMPRESSION: Chronic bronchitic changes with bibasilar atelectasis and small
BILATERAL pleural effusions.
